# Patient Record
Sex: Female | Born: 1995 | Race: White | Hispanic: No | Marital: Single | Smoking: Never smoker
Health system: Southern US, Community
[De-identification: ages and names within clinical notes are randomized; demographics above are authoritative.]

## PROBLEM LIST (undated history)

## (undated) DIAGNOSIS — R002 Palpitations: Secondary | ICD-10-CM

## (undated) DIAGNOSIS — F321 Major depressive disorder, single episode, moderate: Secondary | ICD-10-CM

## (undated) DIAGNOSIS — E032 Hypothyroidism due to medicaments and other exogenous substances: Secondary | ICD-10-CM

## (undated) HISTORY — DX: Hypothyroidism due to medicaments and other exogenous substances: E03.2

## (undated) HISTORY — DX: Major depressive disorder, single episode, moderate: F32.1

## (undated) HISTORY — DX: Palpitations: R00.2

## (undated) HISTORY — PX: NO PAST SURGERIES: SHX2092

---

## 2019-08-27 ENCOUNTER — Other Ambulatory Visit: Payer: Self-pay

## 2019-08-27 MED ORDER — LEVOTHYROXINE SODIUM 150 MCG PO TABS: 150.0000 ug | ORAL_TABLET | Freq: Every day | ORAL | 1 refills | Status: DC

## 2019-08-31 ENCOUNTER — Other Ambulatory Visit: Payer: Self-pay

## 2019-08-31 MED ORDER — ESCITALOPRAM OXALATE 10 MG PO TABS: 10.0000 mg | ORAL_TABLET | Freq: Every day | ORAL | 1 refills | Status: DC

## 2019-09-01 ENCOUNTER — Other Ambulatory Visit: Payer: Self-pay

## 2019-09-01 MED ORDER — ESCITALOPRAM OXALATE 10 MG PO TABS: 10.0000 mg | ORAL_TABLET | Freq: Every day | ORAL | 1 refills | Status: DC

## 2019-11-04 ENCOUNTER — Other Ambulatory Visit: Payer: Self-pay

## 2019-11-04 MED ORDER — ESCITALOPRAM OXALATE 10 MG PO TABS: 10.0000 mg | ORAL_TABLET | Freq: Every day | ORAL | 0 refills | Status: DC

## 2019-11-04 MED ORDER — LEVOTHYROXINE SODIUM 137 MCG PO TABS: 137.0000 ug | ORAL_TABLET | Freq: Every day | ORAL | 1 refills | Status: DC

## 2020-01-04 ENCOUNTER — Ambulatory Visit: Payer: Managed Care, Other (non HMO) | Admitting: Legal Medicine

## 2020-01-04 ENCOUNTER — Encounter: Payer: Self-pay | Admitting: Legal Medicine

## 2020-01-04 ENCOUNTER — Other Ambulatory Visit: Payer: Self-pay

## 2020-01-04 DIAGNOSIS — E032 Hypothyroidism due to medicaments and other exogenous substances: Secondary | ICD-10-CM

## 2020-01-04 DIAGNOSIS — F321 Major depressive disorder, single episode, moderate: Secondary | ICD-10-CM | POA: Insufficient documentation

## 2020-01-04 DIAGNOSIS — E039 Hypothyroidism, unspecified: Secondary | ICD-10-CM | POA: Diagnosis not present

## 2020-01-04 MED ORDER — ESCITALOPRAM OXALATE 10 MG PO TABS: 10.0000 mg | ORAL_TABLET | Freq: Every day | ORAL | 6 refills | Status: DC

## 2020-01-04 NOTE — Progress Notes (Signed)
Subjective:  Patient ID: Rhonda Shah, female    DOB: 20-Apr-1996  Age: 24 y.o. MRN: [id number]  Chief Complaint  Patient presents with  . Hypothyroidism  . Depression    HPI: chronic visit. She recently had a miscarriage. Patient has HYPOTHYROIDISM.  Diagnosed 10 years ago.  Patient has stable thyroid readings.  Patient is having no symptoms.  Last TSH was normal.  continue dosage of thyroid medicine.  This patient has major depression for 6 mpnths.  PHQ9 =3.  Patient is having less anhedonia.  The patient has improved future plans and prospects.  The depression is worse with stress.  The patient is exercising and working on behavior to improve mental health.  Patient is not seeing a therapist or psychiatrist.  na  Patient is on lexaprox.   Current Outpatient Medications on File Prior to Visit  Medication Sig Dispense Refill  . levothyroxine (SYNTHROID) 137 MCG tablet Take 1 tablet (137 mcg total) by mouth daily before breakfast. 30 tablet 1   No current facility-administered medications on file prior to visit.   Past Medical History:  Diagnosis Date  . Hypothyroidism due to medicaments and other exogenous substances   . Major depressive disorder, single episode, moderate (HCC)   . Palpitations    History reviewed. No pertinent surgical history.  Family History  Problem Relation Age of Onset  . Hypothyroidism Mother   . Hypertension Father   . Kidney Stones Father   . Diabetes Father   . Uterine cancer Paternal Grandmother   . Lung cancer Paternal Grandfather    Social History   Socioeconomic History  . Marital status: Married    Spouse name: Not on file  . Number of children: Not on file  . Years of education: Not on file  . Highest education level: Not on file  Occupational History  . Not on file  Tobacco Use  . Smoking status: Never Smoker  . Smokeless tobacco: Never Used  Vaping Use  . Vaping Use: Never used  Substance and Sexual Activity  . Alcohol use: Not  Currently    Comment: Couple times a month  . Drug use: Not Currently  . Sexual activity: Not on file  Other Topics Concern  . Not on file  Social History Narrative  . Not on file   Social Determinants of Health   Financial Resource Strain:   . Difficulty of Paying Living Expenses:   Food Insecurity:   . Worried About Programme researcher, broadcasting/film/video in the Last Year:   . Barista in the Last Year:   Transportation Needs:   . Freight forwarder (Medical):   Marland Kitchen Lack of Transportation (Non-Medical):   Physical Activity:   . Days of Exercise per Week:   . Minutes of Exercise per Session:   Stress:   . Feeling of Stress :   Social Connections:   . Frequency of Communication with Friends and Family:   . Frequency of Social Gatherings with Friends and Family:   . Attends Religious Services:   . Active Member of Clubs or Organizations:   . Attends Banker Meetings:   Marland Kitchen Marital Status:     Review of Systems  Constitutional: Negative.   HENT: Negative.   Eyes: Negative.   Respiratory: Negative.   Cardiovascular: Negative.   Gastrointestinal: Negative.   Musculoskeletal: Negative.   Neurological: Negative.   Psychiatric/Behavioral: Positive for dysphoric mood.     Objective:  BP 126/84  Pulse 97   Temp (!) 97.5 F (36.4 C)   Ht 5\' 2"  (1.575 m)   Wt 191 lb (86.6 kg)   SpO2 98%   BMI 34.93 kg/m   BP/Weight 01/04/2020  Systolic BP 126  Diastolic BP 84  Wt. (Lbs) 191  BMI 34.93    Physical Exam Vitals reviewed.  Constitutional:      Appearance: Normal appearance.  HENT:     Head: Normocephalic and atraumatic.     Right Ear: Tympanic membrane normal.     Left Ear: Tympanic membrane normal.     Mouth/Throat:     Mouth: Mucous membranes are moist.     Pharynx: Oropharynx is clear.  Eyes:     Extraocular Movements: Extraocular movements intact.     Conjunctiva/sclera: Conjunctivae normal.     Pupils: Pupils are equal, round, and reactive to light.   Cardiovascular:     Rate and Rhythm: Normal rate and regular rhythm.     Pulses: Normal pulses.     Heart sounds: Normal heart sounds.  Pulmonary:     Effort: Pulmonary effort is normal.     Breath sounds: Normal breath sounds.  Musculoskeletal:     Cervical back: Normal range of motion and neck supple.  Skin:    General: Skin is warm.  Neurological:     General: No focal deficit present.     Mental Status: She is alert and oriented to person, place, and time.  Psychiatric:        Mood and Affect: Mood normal.        Behavior: Behavior normal.        Thought Content: Thought content normal.        Judgment: Judgment normal.     Depression screen [facility] 2/9 01/04/2020  Decreased Interest 0  Down, Depressed, Hopeless 1  PHQ - 2 Score 1  Altered sleeping 1  Tired, decreased energy 1  Change in appetite 0  Feeling bad or failure about yourself  0  Trouble concentrating 0  Moving slowly or fidgety/restless 0  Suicidal thoughts 0  PHQ-9 Score 3  Difficult doing work/chores Somewhat difficult     No results found for: WBC, HGB, HCT, PLT, GLUCOSE, CHOL, TRIG, HDL, LDLDIRECT, LDLCALC, ALT, AST, NA, K, CL, CREATININE, BUN, CO2, TSH, PSA, INR, GLUF, HGBA1C, MICROALBUR    Assessment & Plan:     1. Major depressive disorder, single episode, moderate (HCC) - escitalopram (LEXAPRO) 10 MG tablet; Take 1 tablet (10 mg total) by mouth daily. Please call to set up follow up within the next couple of months. Thanks, Dr. 01/06/2020  Dispense: 30 tablet; Refill: 6 Patient's depression is controlled with lexapro.   Anhedonia better.  PHQ 9 was performed score 3. An individual care plan was established or reinforced today.  The patient's disease status was assessed using clinical findings on exam, labs, and or other diagnostic testing to determine patient's success in meeting treatment goals based on disease specific evidence-based guidelines and found to be improving Recommendations include stay on  medicines  2. Primary hypothyroidism - TSH - Comprehensive metabolic panel Patient is known to have hypothryoidism and is n treatment with levothyroxine [place] Muta.  Patient was diagnosed 10 years ago.  Other treatment includes none.  Patient is compliant with medicines and last TSH 6 months ago.  Last TSH was high and thyroid raised    Meds ordered this encounter  Medications  . escitalopram (LEXAPRO) 10 MG tablet    Sig:  Take 1 tablet (10 mg total) by mouth daily. Please call to set up follow up within the next couple of months. Thanks, Dr. Sed[place] Muta    Dispense:  30 tablet    Refill:  6    Orders Placed This Encounter  Procedures  . TSH  . Comprehensive metabolic panel     Follow-up: Return in about 6 months (around 07/05/2020).  An After Visit Summary was printed and given to the patient.  Brent Bulla [facility] [phone]

## 2020-01-05 ENCOUNTER — Other Ambulatory Visit: Payer: Self-pay | Admitting: Legal Medicine

## 2020-01-05 ENCOUNTER — Other Ambulatory Visit: Payer: Self-pay

## 2020-01-05 DIAGNOSIS — E039 Hypothyroidism, unspecified: Secondary | ICD-10-CM

## 2020-01-05 LAB — COMPREHENSIVE METABOLIC PANEL
ALT: 12 IU/L (ref 0–32)
AST: 15 IU/L (ref 0–40)
Albumin/Globulin Ratio: 1.5 (ref 1.2–2.2)
Albumin: 4.5 g/dL (ref 3.9–5.0)
Alkaline Phosphatase: 74 IU/L (ref 48–121)
BUN/Creatinine Ratio: 15 (ref 9–23)
BUN: 9 mg/dL (ref 6–20)
Bilirubin Total: <0.2 mg/dL (ref 0.0–1.2)
CO2: 23 mmol/L (ref 20–29)
Calcium: 9.2 mg/dL (ref 8.7–10.2)
Chloride: 105 mmol/L (ref 96–106)
Creatinine, Ser: 0.59 mg/dL (ref 0.57–1.00)
GFR calc Af Amer: 147 mL/min/{1.73_m2} (ref >59)
GFR calc non Af Amer: 128 mL/min/{1.73_m2} (ref >59)
Globulin, Total: 3.1 g/dL (ref 1.5–4.5)
Glucose: 72 mg/dL (ref 65–99)
Potassium: 4.3 mmol/L (ref 3.5–5.2)
Sodium: 139 mmol/L (ref 134–144)
Total Protein: 7.6 g/dL (ref 6.0–8.5)

## 2020-01-05 LAB — TSH: TSH: 43.4 u[IU]/mL — ABNORMAL HIGH (ref 0.450–4.500)

## 2020-01-05 MED ORDER — LEVOTHYROXINE SODIUM 150 MCG PO TABS: 150.0000 ug | ORAL_TABLET | Freq: Every day | ORAL | 3 refills | Status: DC

## 2020-01-05 NOTE — Progress Notes (Signed)
Tsh high 43, increase to thyroid, recheck 10 weeks lp

## 2020-02-02 DIAGNOSIS — F321 Major depressive disorder, single episode, moderate: Secondary | ICD-10-CM | POA: Insufficient documentation

## 2020-06-26 ENCOUNTER — Emergency Department (HOSPITAL_COMMUNITY)
Admission: EM | Admit: 2020-06-26 | Discharge: 2020-06-26 | Disposition: A | Discharge status: Home / Self Care | Payer: No Typology Code available for payment source | Attending: Emergency Medicine | Admitting: Emergency Medicine

## 2020-06-26 ENCOUNTER — Emergency Department (HOSPITAL_COMMUNITY): Payer: No Typology Code available for payment source

## 2020-06-26 ENCOUNTER — Other Ambulatory Visit: Payer: Self-pay

## 2020-06-26 ENCOUNTER — Encounter (HOSPITAL_COMMUNITY): Payer: Self-pay | Admitting: Emergency Medicine

## 2020-06-26 DIAGNOSIS — R109 Unspecified abdominal pain: Secondary | ICD-10-CM | POA: Insufficient documentation

## 2020-06-26 DIAGNOSIS — R110 Nausea: Secondary | ICD-10-CM | POA: Diagnosis not present

## 2020-06-26 DIAGNOSIS — M25552 Pain in left hip: Secondary | ICD-10-CM | POA: Diagnosis not present

## 2020-06-26 DIAGNOSIS — Z5321 Procedure and treatment not carried out due to patient leaving prior to being seen by health care provider: Secondary | ICD-10-CM | POA: Diagnosis not present

## 2020-06-26 DIAGNOSIS — Y92410 Unspecified street and highway as the place of occurrence of the external cause: Secondary | ICD-10-CM | POA: Insufficient documentation

## 2020-06-26 DIAGNOSIS — M545 Low back pain, unspecified: Secondary | ICD-10-CM | POA: Diagnosis present

## 2020-06-26 DIAGNOSIS — M25551 Pain in right hip: Secondary | ICD-10-CM | POA: Diagnosis not present

## 2020-06-26 LAB — URINALYSIS, ROUTINE W REFLEX MICROSCOPIC
Bacteria, UA: NONE SEEN
Bilirubin Urine: NEGATIVE
Glucose, UA: NEGATIVE mg/dL
Ketones, ur: NEGATIVE mg/dL
Leukocytes,Ua: NEGATIVE
Nitrite: NEGATIVE
Protein, ur: NEGATIVE mg/dL
Specific Gravity, Urine: 1.004 — ABNORMAL LOW (ref 1.005–1.030)
pH: 6 (ref 5.0–8.0)

## 2020-06-26 LAB — I-STAT BETA HCG BLOOD, ED (MC, WL, AP ONLY): I-stat hCG, quantitative: <5 m[IU]/mL (ref <5)
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
## 2020-06-26 NOTE — ED Notes (Signed)
Pt stated she was leaving.  

## 2020-07-07 ENCOUNTER — Other Ambulatory Visit: Payer: Self-pay | Admitting: Legal Medicine

## 2020-07-07 DIAGNOSIS — E039 Hypothyroidism, unspecified: Secondary | ICD-10-CM

## 2020-11-17 ENCOUNTER — Other Ambulatory Visit: Payer: Self-pay | Admitting: Legal Medicine

## 2020-11-17 DIAGNOSIS — F321 Major depressive disorder, single episode, moderate: Secondary | ICD-10-CM

## 2021-05-22 ENCOUNTER — Other Ambulatory Visit: Payer: Self-pay | Admitting: Legal Medicine

## 2021-05-22 DIAGNOSIS — F321 Major depressive disorder, single episode, moderate: Secondary | ICD-10-CM

## 2021-05-22 DIAGNOSIS — E039 Hypothyroidism, unspecified: Secondary | ICD-10-CM
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
## 2022-05-02 ENCOUNTER — Ambulatory Visit: Payer: Self-pay | Admitting: Legal Medicine

## 2022-05-02 ENCOUNTER — Encounter: Payer: Self-pay | Admitting: Legal Medicine

## 2022-05-02 VITALS — BP 120/90 | HR 97 | Temp 99.5°F | Resp 14 | Ht 62.0 in | Wt 185.0 lb

## 2022-05-02 DIAGNOSIS — J9801 Acute bronchospasm: Secondary | ICD-10-CM

## 2022-05-02 MED ORDER — TRIAMCINOLONE ACETONIDE 40 MG/ML IJ SUSP
60.0000 mg | Freq: Once | INTRAMUSCULAR | Status: AC
  Administered 2022-05-02: 60 mg via INTRAMUSCULAR

## 2022-05-02 MED ORDER — AZITHROMYCIN 250 MG PO TABS: ORAL_TABLET | ORAL | 0 refills | Status: AC

## 2022-05-02 MED ORDER — ALBUTEROL SULFATE HFA 108 (90 BASE) MCG/ACT IN AERS
2.0000 | INHALATION_SPRAY | Freq: Four times a day (QID) | RESPIRATORY_TRACT | 0 refills | Status: DC | PRN
Start: 2022-05-02 — End: 2022-11-20

## 2022-05-02 NOTE — Progress Notes (Signed)
Acute Office Visit  Subjective:    Patient ID: Rhonda Shah, female    DOB: 03-Jun-1996, 26 y.o.   MRN: [id number]  Chief Complaint  Patient presents with   Cough   chest congestion    HPI: Patient is in today for cough, and chest congestion since 3 weeks ago. She did covid test at home today and it was negative.Having cough.. No history of asthma.  No definite wheezing.   Past Medical History:  Diagnosis Date   Hypothyroidism due to medicaments and other exogenous substances    Major depressive disorder, single episode, moderate (HCC)    Palpitations     Past Surgical History:  Procedure Laterality Date   NO PAST SURGERIES      Family History  Problem Relation Age of Onset   Hypothyroidism Mother    Hypertension Father    Kidney Stones Father    Diabetes Father    Uterine cancer Paternal Grandmother    Lung cancer Paternal Grandfather     Social History   Socioeconomic History   Marital status: Married    Spouse name: Not on file   Number of children: Not on file   Years of education: Not on file   Highest education level: Not on file  Occupational History   Not on file  Tobacco Use   Smoking status: Never   Smokeless tobacco: Never  Vaping Use   Vaping Use: Never used  Substance and Sexual Activity   Alcohol use: Yes    Comment: Couple times a month   Drug use: Not Currently   Sexual activity: Not Currently    Partners: Male  Other Topics Concern   Not on file  Social History Narrative   Not on file   Social Determinants of Health   Financial Resource Strain: Not on file  Food Insecurity: Not on file  Transportation Needs: Not on file  Physical Activity: Not on file  Stress: Not on file  Social Connections: Not on file  Intimate Partner Violence: Not on file    Outpatient Medications Prior to Visit  Medication Sig Dispense Refill   levothyroxine (SYNTHROID) 150 MCG tablet Take 1 tablet by mouth once daily 90 tablet 2   escitalopram  (LEXAPRO) 10 MG tablet Take 1 tablet by mouth once daily 30 tablet 0   No facility-administered medications prior to visit.    Allergies  Allergen Reactions   Amoxicillin-Pot Clavulanate Itching    Review of Systems  Constitutional:  Positive for fever. Negative for chills and fatigue.  HENT:  Negative for congestion, ear pain, sinus pressure and sore throat.   Respiratory:  Positive for cough, chest tightness and shortness of breath.   Cardiovascular:  Negative for chest pain and palpitations.  Gastrointestinal:  Negative for abdominal pain, constipation, diarrhea, nausea and vomiting.  Endocrine: Negative for polydipsia, polyphagia and polyuria.  Genitourinary:  Negative for difficulty urinating and dysuria.  Musculoskeletal:  Negative for arthralgias, back pain and myalgias.  Skin:  Negative for rash.  Neurological:  Negative for headaches.  Psychiatric/Behavioral:  Negative for dysphoric mood. The patient is not nervous/anxious.        Objective:    Physical Exam Vitals reviewed.  Constitutional:      General: She is not in acute distress.    Appearance: Normal appearance.  HENT:     Head: Normocephalic and atraumatic.     Right Ear: Tympanic membrane normal.     Left Ear: Tympanic membrane normal.  Nose: Nose normal.     Mouth/Throat:     Mouth: Mucous membranes are moist.  Eyes:     Extraocular Movements: Extraocular movements intact.     Conjunctiva/sclera: Conjunctivae normal.     Pupils: Pupils are equal, round, and reactive to light.  Cardiovascular:     Rate and Rhythm: Normal rate and regular rhythm.     Pulses: Normal pulses.     Heart sounds: No murmur heard.    No gallop.  Pulmonary:     Effort: Pulmonary effort is normal. No respiratory distress.     Breath sounds: Normal breath sounds. No wheezing.  Abdominal:     General: Abdomen is flat. Bowel sounds are normal. There is no distension.     Tenderness: There is no abdominal tenderness.   Musculoskeletal:        General: Normal range of motion.     Cervical back: Normal range of motion and neck supple.     Right lower leg: No edema.     Left lower leg: No edema.  Skin:    General: Skin is warm.     Capillary Refill: Capillary refill takes less than 2 seconds.  Neurological:     General: No focal deficit present.     Mental Status: She is alert and oriented to person, place, and time. Mental status is at baseline.     Gait: Gait normal.     Deep Tendon Reflexes: Reflexes normal.  Psychiatric:        Mood and Affect: Mood normal.        Behavior: Behavior normal.        Thought Content: Thought content normal.        Judgment: Judgment normal.     BP (!) 120/90   Pulse 97   Temp 99.5 F (37.5 C)   Resp 14   Ht 5\' 2"  (1.575 m)   Wt 185 lb (83.9 kg)   LMP 05/01/2022 (Exact Date)   SpO2 99%   BMI 33.84 kg/m  Wt Readings from Last 3 Encounters:  05/02/22 185 lb (83.9 kg)  01/04/20 191 lb (86.6 kg)    Health Maintenance Due  Topic Date Due   HIV Screening  Never done   Hepatitis C Screening  Never done   TETANUS/TDAP  Never done   PAP-Cervical Cytology Screening  Never done   PAP SMEAR-Modifier  Never done   COVID-19 Vaccine (3 - Pfizer series) 07/06/2021   INFLUENZA VACCINE  Never done    There are no preventive care reminders to display for this patient.   Lab Results  Component Value Date   TSH 43.400 (H) 01/04/2020   No results found for: "WBC", "HGB", "HCT", "MCV", "PLT" Lab Results  Component Value Date   NA 139 01/04/2020   K 4.3 01/04/2020   CO2 23 01/04/2020   GLUCOSE 72 01/04/2020   BUN 9 01/04/2020   CREATININE 0.59 01/04/2020   BILITOT <0.2 01/04/2020   ALKPHOS 74 01/04/2020   AST 15 01/04/2020   ALT 12 01/04/2020   PROT 7.6 01/04/2020   ALBUMIN 4.5 01/04/2020   CALCIUM 9.2 01/04/2020   No results found for: "CHOL" No results found for: "HDL" No results found for: "LDLCALC" No results found for: "TRIG" No results found  for: "CHOLHDL" No results found for: "HGBA1C"     Assessment & Plan:   Problem List Items Addressed This Visit    Visit Diagnoses     Bronchospasm    -  Primary   Relevant Medications   triamcinolone acetonide (KENALOG-40) injection 60 mg (Completed)   azithromycin (ZITHROMAX) 250 MG tablet   albuterol (VENTOLIN HFA) 108 (90 Base) MCG/ACT inhaler Treat bronchospasm with albuterol and trimcinolone, z-pack           Follow-up: Return if symptoms worsen or fail to improve.  An After Visit Summary was printed and given to the patient.  Reinaldo Meeker, MD [facility] [phone]

## 2022-06-16 ENCOUNTER — Other Ambulatory Visit: Payer: Self-pay | Admitting: Legal Medicine

## 2022-06-16 DIAGNOSIS — E039 Hypothyroidism, unspecified: Secondary | ICD-10-CM

## 2022-06-29 ENCOUNTER — Ambulatory Visit: Payer: Self-pay | Admitting: Nurse Practitioner

## 2022-07-18 ENCOUNTER — Encounter: Payer: Self-pay | Admitting: Nurse Practitioner

## 2022-07-18 ENCOUNTER — Ambulatory Visit (INDEPENDENT_AMBULATORY_CARE_PROVIDER_SITE_OTHER): Payer: Self-pay | Admitting: Nurse Practitioner

## 2022-07-18 VITALS — BP 132/74 | HR 102 | Temp 97.5°F | Ht 62.0 in | Wt 178.0 lb

## 2022-07-18 DIAGNOSIS — J029 Acute pharyngitis, unspecified: Secondary | ICD-10-CM

## 2022-07-18 DIAGNOSIS — N926 Irregular menstruation, unspecified: Secondary | ICD-10-CM

## 2022-07-18 LAB — POCT URINE PREGNANCY: Preg Test, Ur: NEGATIVE

## 2022-07-18 MED ORDER — AZITHROMYCIN 250 MG PO TABS: ORAL_TABLET | ORAL | 0 refills | Status: AC

## 2022-07-18 NOTE — Progress Notes (Signed)
Acute Office Visit  Subjective:    Patient ID: Rhonda Shah, female    DOB: Mar 16, 1996, 27 y.o.   MRN: [id number]  Chief Complaint  Patient presents with   Sore Throat    HPI: Patient is in today for Sore throat symptoms She complains of left ear pressure/pain, congestion, post nasal drip, and sore throat. Denies fever, chills, night sweats or weight loss. Onset of symptoms was a few days ago and worsening.She is drinking plenty of fluids. Treatment has included antihistamine, Delsym, Mucinex, cough drops, salt water gargles, Tylenol/Ibuprofen. 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    Past Surgical History:  Procedure Laterality Date   NO PAST SURGERIES      Family History  Problem Relation Age of Onset   Hypothyroidism Mother    Hypertension Father    Kidney Stones Father    Diabetes Father    Uterine cancer Paternal Grandmother    Lung cancer Paternal Grandfather     Social History   Socioeconomic History   Marital status: Married    Spouse name: Not on file   Number of children: Not on file   Years of education: Not on file   Highest education level: Not on file  Occupational History   Not on file  Tobacco Use   Smoking status: Never   Smokeless tobacco: Never  Vaping Use   Vaping Use: Never used  Substance and Sexual Activity   Alcohol use: Yes    Comment: Couple times a month   Drug use: Not Currently   Sexual activity: Not Currently    Partners: Male  Other Topics Concern   Not on file  Social History Narrative   Not on file   Social Determinants of Health   Financial Resource Strain: Not on file  Food Insecurity: Not on file  Transportation Needs: Not on file  Physical Activity: Not on file  Stress: Not on file  Social Connections: Not on file  Intimate Partner Violence: Not on file    Outpatient Medications Prior to Visit  Medication Sig Dispense Refill   albuterol (VENTOLIN HFA) 108 (90 Base) MCG/ACT  inhaler Inhale 2 puffs into the lungs every 6 (six) hours as needed for wheezing or shortness of breath. 8 g 0   levothyroxine (SYNTHROID) 150 MCG tablet Take 1 tablet by mouth once daily 90 tablet 2   No facility-administered medications prior to visit.    Allergies  Allergen Reactions   Amoxicillin-Pot Clavulanate Itching    Review of Systems See pertinent positives and negatives per HPI.     Objective:    Physical Exam Vitals reviewed.  HENT:     Right Ear: Tympanic membrane normal.     Left Ear: Tympanic membrane normal.     Nose: Congestion and rhinorrhea present.     Mouth/Throat:     Pharynx: Uvula midline. Pharyngeal swelling, posterior oropharyngeal erythema and uvula swelling present.     Tonsils: No tonsillar exudate or tonsillar abscesses. 2+ on the right. 2+ on the left.  Cardiovascular:     Rate and Rhythm: Tachycardia present.     Heart sounds: Normal heart sounds.  Pulmonary:     Effort: Pulmonary effort is normal.     Breath sounds: Normal breath sounds.  Lymphadenopathy:     Cervical: Cervical adenopathy present.  Skin:    General: Skin is warm and dry.     Capillary Refill: Capillary refill takes less than 2  seconds.  Neurological:     Mental Status: She is alert.     BP 132/74   Pulse (!) 102   Temp (!) 97.5 F (36.4 C)   Ht 5\' 2"  (1.575 m)   Wt 178 lb (80.7 kg)   SpO2 99%   BMI 32.56 kg/m   Wt Readings from Last 3 Encounters:  07/18/22 178 lb (80.7 kg)  05/02/22 185 lb (83.9 kg)  01/04/20 191 lb (86.6 kg)    Health Maintenance Due  Topic Date Due   HIV Screening  Never done   Hepatitis C Screening  Never done   DTaP/Tdap/Td (1 - Tdap) Never done   PAP-Cervical Cytology Screening  Never done   PAP SMEAR-Modifier  Never done   COVID-19 Vaccine (3 - 2023-24 season) 03/09/2022       Lab Results  Component Value Date   TSH 43.400 (H) 01/04/2020   No results found for: "WBC", "HGB", "HCT", "MCV", "PLT" Lab Results  Component  Value Date   NA 139 01/04/2020   K 4.3 01/04/2020   CO2 23 01/04/2020   GLUCOSE 72 01/04/2020   BUN 9 01/04/2020   CREATININE 0.59 01/04/2020   BILITOT <0.2 01/04/2020   ALKPHOS 74 01/04/2020   AST 15 01/04/2020   ALT 12 01/04/2020   PROT 7.6 01/04/2020   ALBUMIN 4.5 01/04/2020   CALCIUM 9.2 01/04/2020      Assessment & Plan:     1. Pharyngitis, unspecified etiology - Mononucleosis Test, Qual W/ Reflex - CBC with Differential/Platelet - azithromycin (ZITHROMAX) 250 MG tablet; Take 2 tablets on day 1, then 1 tablet daily on days 2 through 5  Dispense: 6 tablet; Refill: 0  2. Irregular periods/menstrual cycles - T4, free - TSH - POCT urine pregnancy - CBC with Differential/Platelet - Comprehensive metabolic panel    Warm salt water gargles Replace toothbrush and toothpaste Rest and push fluids Take Z-pack as directed, with food Ibuprofen as needed for pain Follow-up as needed   Follow-up: PRN  An After Visit Summary was printed and given to the patient.  I, Rip Harbour, NP, have reviewed all documentation for this visit. The documentation on 07/18/22 for the exam, diagnosis, procedures, and orders are all accurate and complete.    Signed,  Rip Harbour, NP [place] [facility] [phone]

## 2022-07-18 NOTE — Patient Instructions (Addendum)
Warm salt water gargles Replace toothbrush and toothpaste Rest and push fluids Take Z-pack as directed, with food Ibuprofen as needed for pain Follow-up as needed   Pharyngitis  Pharyngitis is inflammation of the throat (pharynx). It is a very common cause of sore throat. Pharyngitis can be caused by a bacteria, but it is usually caused by a virus. Most cases of pharyngitis get better on their own without treatment. What are the causes? This condition may be caused by: Infection by viruses (viral). Viral pharyngitis spreads easily from person to person (is contagious) through coughing, sneezing, and sharing of personal items or utensils such as cups, forks, spoons, and toothbrushes. Infection by bacteria (bacterial). Bacterial pharyngitis may be spread by touching the nose or face after coming in contact with the bacteria, or through close contact, such as kissing. Allergies. Allergies can cause buildup of mucus in the throat (post-nasal drip), leading to inflammation and irritation. Allergies can also cause blocked nasal passages, forcing breathing through the mouth, which dries and irritates the throat. What increases the risk? You are more likely to develop this condition if: You are 62-37 years old. You are exposed to crowded environments such as daycare, school, or dormitory living. You live in a cold climate. You have a weakened disease-fighting (immune) system. What are the signs or symptoms? Symptoms of this condition vary by the cause. Common symptoms of this condition include: Sore throat. Fatigue. Low-grade fever. Stuffy nose (nasal congestion) and cough. Headache. Other symptoms may include: Glands in the neck (lymph nodes) that are swollen. Skin rashes. Plaque-like film on the throat or tonsils. This is often a symptom of bacterial pharyngitis. Vomiting. Red, itchy eyes (conjunctivitis). Loss of appetite. Joint pain and muscle aches. Enlarged tonsils. How is this  diagnosed? This condition may be diagnosed based on your medical history and a physical exam. Your health care provider will ask you questions about your illness and your symptoms. A swab of your throat may be done to check for bacteria (rapid strep test). Other lab tests may also be done, depending on the suspected cause, but these are rare. How is this treated? Many times, treatment is not needed for this condition. Pharyngitis usually gets better in 3-4 days without treatment. Bacterial pharyngitis may be treated with antibiotic medicines. Follow these instructions at home: Medicines Take over-the-counter and prescription medicines only as told by your health care provider. If you were prescribed an antibiotic medicine, take it as told by your health care provider. Do not stop taking the antibiotic even if you start to feel better. Use throat sprays to soothe your throat as told by your health care provider. Children can get pharyngitis. Do not give your child aspirin because of the association with Reye's syndrome. Managing pain To help with pain, try: Sipping warm liquids, such as broth, herbal tea, or warm water. Eating or drinking cold or frozen liquids, such as frozen ice pops. Gargling with a mixture of salt and water 3-4 times a day or as needed. To make salt water, completely dissolve -1 tsp (3-6 g) of salt in 1 cup (237 mL) of warm water. Sucking on hard candy or throat lozenges. Putting a cool-mist humidifier in your bedroom at night to moisten the air. Sitting in the bathroom with the door closed for 5-10 minutes while you run hot water in the shower.  General instructions  Do not use any products that contain nicotine or tobacco. These products include cigarettes, chewing tobacco, and vaping devices, such as  e-cigarettes. If you need help quitting, ask your health care provider. Rest as told by your health care provider. Drink enough fluid to keep your urine pale yellow. How  is this prevented? To help prevent becoming infected or spreading infection: Wash your hands often with soap and water for at least 20 seconds. If soap and water are not available, use hand sanitizer. Do not touch your eyes, nose, or mouth with unwashed hands, and wash hands after touching these areas. Do not share cups or eating utensils. Avoid close contact with people who are sick. Contact a health care provider if: You have large, tender lumps in your neck. You have a rash. You cough up green, yellow-brown, or bloody mucus. Get help right away if: Your neck becomes stiff. You drool or are unable to swallow liquids. You cannot drink or take medicines without vomiting. You have severe pain that does not go away, even after you take medicine. You have trouble breathing, and it is not caused by a stuffy nose. You have new pain and swelling in your joints such as the knees, ankles, wrists, or elbows. These symptoms may represent a serious problem that is an emergency. Do not wait to see if the symptoms will go away. Get medical help right away. Call your local emergency services (911 in the [place] Do not drive yourself to the hospital. Summary Pharyngitis is redness, pain, and swelling (inflammation) of the throat (pharynx). While pharyngitis can be caused by a bacteria, the most common causes are viral. Most cases of pharyngitis get better on their own without treatment. Bacterial pharyngitis is treated with antibiotic medicines. This information is not intended to replace advice given to you by your health care provider. Make sure you discuss any questions you have with your health care provider. Document Revised: 09/21/2020 Document Reviewed: 09/21/2020 Elsevier Patient Education  [place].

## 2022-07-19 LAB — COMPREHENSIVE METABOLIC PANEL
ALT: 13 IU/L (ref 0–32)
AST: 12 IU/L (ref 0–40)
Albumin/Globulin Ratio: 1.5 (ref 1.2–2.2)
Albumin: 4.2 g/dL (ref 4.0–5.0)
Alkaline Phosphatase: 73 IU/L (ref 44–121)
BUN/Creatinine Ratio: 17 (ref 9–23)
BUN: 12 mg/dL (ref 6–20)
Bilirubin Total: <0.2 mg/dL (ref 0.0–1.2)
CO2: 21 mmol/L (ref 20–29)
Calcium: 8.8 mg/dL (ref 8.7–10.2)
Chloride: 106 mmol/L (ref 96–106)
Creatinine, Ser: 0.72 mg/dL (ref 0.57–1.00)
Globulin, Total: 2.8 g/dL (ref 1.5–4.5)
Glucose: 85 mg/dL (ref 70–99)
Potassium: 4.4 mmol/L (ref 3.5–5.2)
Sodium: 144 mmol/L (ref 134–144)
Total Protein: 7 g/dL (ref 6.0–8.5)
eGFR: 117 mL/min/{1.73_m2} (ref >59)

## 2022-07-19 LAB — CBC WITH DIFFERENTIAL/PLATELET
Basophils Absolute: 0 10*3/uL (ref 0.0–0.2)
Basos: 0 %
EOS (ABSOLUTE): 0.1 10*3/uL (ref 0.0–0.4)
Eos: 1 %
Hematocrit: 40.2 % (ref 34.0–46.6)
Hemoglobin: 13 g/dL (ref 11.1–15.9)
Immature Grans (Abs): 0 10*3/uL (ref 0.0–0.1)
Immature Granulocytes: 0 %
Lymphocytes Absolute: 1.8 10*3/uL (ref 0.7–3.1)
Lymphs: 17 %
MCH: 27.4 pg (ref 26.6–33.0)
MCHC: 32.3 g/dL (ref 31.5–35.7)
MCV: 85 fL (ref 79–97)
Monocytes Absolute: 0.6 10*3/uL (ref 0.1–0.9)
Monocytes: 6 %
Neutrophils Absolute: 7.6 10*3/uL — ABNORMAL HIGH (ref 1.4–7.0)
Neutrophils: 76 %
Platelets: 362 10*3/uL (ref 150–450)
RBC: 4.74 x10E6/uL (ref 3.77–5.28)
RDW: 15.9 % — ABNORMAL HIGH (ref 11.7–15.4)
WBC: 10.2 10*3/uL (ref 3.4–10.8)

## 2022-07-19 LAB — MONO QUAL W/RFLX QN: Mono Qual W/Rflx Qn: NEGATIVE

## 2022-07-19 LAB — T4, FREE: Free T4: 1.23 ng/dL (ref 0.82–1.77)

## 2022-07-19 LAB — TSH: TSH: 14.3 u[IU]/mL — ABNORMAL HIGH (ref 0.450–4.500)

## 2022-07-31 ENCOUNTER — Other Ambulatory Visit: Payer: Self-pay

## 2022-07-31 DIAGNOSIS — E039 Hypothyroidism, unspecified: Secondary | ICD-10-CM

## 2022-11-20 ENCOUNTER — Ambulatory Visit (INDEPENDENT_AMBULATORY_CARE_PROVIDER_SITE_OTHER): Payer: Self-pay | Admitting: Family Medicine

## 2022-11-20 ENCOUNTER — Encounter: Payer: Self-pay | Admitting: Family Medicine

## 2022-11-20 VITALS — BP 130/86 | HR 99 | Temp 98.4°F | Resp 12 | Ht 62.0 in | Wt 176.0 lb

## 2022-11-20 DIAGNOSIS — R5081 Fever presenting with conditions classified elsewhere: Secondary | ICD-10-CM

## 2022-11-20 DIAGNOSIS — H8111 Benign paroxysmal vertigo, right ear: Secondary | ICD-10-CM

## 2022-11-20 DIAGNOSIS — R509 Fever, unspecified: Secondary | ICD-10-CM | POA: Insufficient documentation

## 2022-11-20 LAB — POCT URINALYSIS DIP (CLINITEK)
Bilirubin, UA: NEGATIVE
Glucose, UA: NEGATIVE mg/dL
Ketones, POC UA: NEGATIVE mg/dL
Leukocytes, UA: NEGATIVE
Nitrite, UA: NEGATIVE
POC PROTEIN,UA: NEGATIVE
Spec Grav, UA: 1.015 (ref 1.010–1.025)
Urobilinogen, UA: 0.2 E.U./dL
pH, UA: 8.5 — AB (ref 5.0–8.0)

## 2022-11-20 LAB — POCT INFLUENZA A/B
Influenza A, POC: NEGATIVE
Influenza B, POC: NEGATIVE

## 2022-11-20 NOTE — Assessment & Plan Note (Signed)
Exercises for BPV was given.

## 2022-11-20 NOTE — Progress Notes (Signed)
Acute Office Visit  Subjective:    Patient ID: Rhonda Shah, female    DOB: 23-Jan-1996, 27 y.o.   MRN: [id number]  Chief Complaint  Patient presents with   Fever    HPI: Patient is in today for 5 days of fever, chills. She has been checking her temperature at home and last Thursday was 101.6, then came back normal. Sunday was 100.6 and yesterday was 100.8. she has been taking tylenol and ibuprofen for fever. She also mentioned lightheaded and dizziness. Her period was heavier last Sunday. 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 She denies thick bite. She said to have some allergies and some flank/back pain. She denies any chest pain, SOB, cough, chest congestion, dysuria, abdominal pain, diarrhea or nauseas.  Past Medical History:  Diagnosis Date   Hypothyroidism due to medicaments and other exogenous substances    Major depressive disorder, single episode, moderate (HCC)    Palpitations     Past Surgical History:  Procedure Laterality Date   NO PAST SURGERIES      Family History  Problem Relation Age of Onset   Hypothyroidism Mother    Hypertension Father    Kidney Stones Father    Diabetes Father    Uterine cancer Paternal Grandmother    Lung cancer Paternal Grandfather     Social History   Socioeconomic History   Marital status: Married    Spouse name: Not on file   Number of children: Not on file   Years of education: Not on file   Highest education level: Not on file  Occupational History   Not on file  Tobacco Use   Smoking status: Never   Smokeless tobacco: Never  Vaping Use   Vaping Use: Never used  Substance and Sexual Activity   Alcohol use: Yes    Comment: Couple times a month   Drug use: Not Currently   Sexual activity: Not Currently    Partners: Male  Other Topics Concern   Not on file  Social History Narrative   Not on file   Social Determinants of Health   Financial Resource Strain: Not on file  Food Insecurity: Not  on file  Transportation Needs: Not on file  Physical Activity: Not on file  Stress: Not on file  Social Connections: Not on file  Intimate Partner Violence: Not on file    Outpatient Medications Prior to Visit  Medication Sig Dispense Refill   levothyroxine (SYNTHROID) 150 MCG tablet Take 1 tablet by mouth once daily 90 tablet 2   albuterol (VENTOLIN HFA) 108 (90 Base) MCG/ACT inhaler Inhale 2 puffs into the lungs every 6 (six) hours as needed for wheezing or shortness of breath. 8 g 0   No facility-administered medications prior to visit.    Allergies  Allergen Reactions   Amoxicillin-Pot Clavulanate Itching    Review of Systems  Constitutional:  Positive for chills and fever. Negative for fatigue.  HENT:  Positive for sneezing. Negative for congestion, ear pain and sore throat.   Respiratory:  Negative for cough and shortness of breath.   Cardiovascular:  Negative for chest pain.  Gastrointestinal:  Negative for abdominal pain, constipation, diarrhea and nausea.  Musculoskeletal:  Positive for back pain and myalgias.  Neurological:  Positive for dizziness and light-headedness.       Objective:        11/20/2022    9:48 AM 07/18/2022    8:27 AM 05/02/2022   10:59 AM  Vitals  with BMI  Height 5\' 2"  5\' 2"    Weight 176 lbs 178 lbs   BMI 32.18 32.55   Systolic 130 132 098  Diastolic 86 74 90  Pulse 99 102     No data found.   Physical Exam Vitals reviewed.  Constitutional:      Appearance: Normal appearance. She is normal weight.  HENT:     Right Ear: Tympanic membrane, ear canal and external ear normal.     Left Ear: Tympanic membrane, ear canal and external ear normal.     Nose: Nose normal.     Right Turbinates: Swollen (redness).     Left Turbinates: Swollen and pale (blue).     Comments: Nontender Sinuses    Mouth/Throat:     Pharynx: Oropharynx is clear.  Cardiovascular:     Rate and Rhythm: Normal rate and regular rhythm.     Heart sounds: Normal  heart sounds. No murmur heard. Pulmonary:     Effort: Pulmonary effort is normal. No respiratory distress.     Breath sounds: Normal breath sounds.  Abdominal:     General: Abdomen is flat. Bowel sounds are normal.     Palpations: Abdomen is soft.     Tenderness: There is no abdominal tenderness.  Lymphadenopathy:     Cervical: No cervical adenopathy.  Neurological:     Mental Status: She is alert and oriented to person, place, and time.     Comments: Epley maneuver positive to the right. Negative to the left.   Psychiatric:        Mood and Affect: Mood normal.        Behavior: Behavior normal.     Health Maintenance Due  Topic Date Due   HIV Screening  Never done   Hepatitis C Screening  Never done   DTaP/Tdap/Td (1 - Tdap) Never done   PAP-Cervical Cytology Screening  Never done   PAP SMEAR-Modifier  Never done   COVID-19 Vaccine (3 - 2023-24 season) 03/09/2022    There are no preventive care reminders to display for this patient.   Lab Results  Component Value Date   TSH 14.300 (H) 07/18/2022   Lab Results  Component Value Date   WBC 10.2 07/18/2022   HGB 13.0 07/18/2022   HCT 40.2 07/18/2022   MCV 85 07/18/2022   PLT 362 07/18/2022   Lab Results  Component Value Date   NA 144 07/18/2022   K 4.4 07/18/2022   CO2 21 07/18/2022   GLUCOSE 85 07/18/2022   BUN 12 07/18/2022   CREATININE 0.72 07/18/2022   BILITOT <0.2 07/18/2022   ALKPHOS 73 07/18/2022   AST 12 07/18/2022   ALT 13 07/18/2022   PROT 7.0 07/18/2022   ALBUMIN 4.2 07/18/2022   CALCIUM 8.8 07/18/2022   EGFR 117 07/18/2022   No results found for: "CHOL" No results found for: "HDL" No results found for: "LDLCALC" No results found for: "TRIG" No results found for: "CHOLHDL" No results found for: "HGBA1C"     Assessment & Plan:  Benign paroxysmal positional vertigo of right ear Assessment & Plan: Exercises for BPV was given.   Fever in other diseases Assessment & Plan: Ordered rapid  flu A and B. Ordered UA.  Orders: -     POCT URINALYSIS DIP (CLINITEK) -     POCT Influenza A/B    All negative.  No orders of the defined types were placed in this encounter.   Orders Placed This Encounter  Procedures  POCT URINALYSIS DIP (CLINITEK)   Influenza A/B     Follow-up: No follow-ups on file.  An After Visit Summary was printed and given to the patient.  Blane Ohara, MD [facility] [phone]

## 2022-11-20 NOTE — Assessment & Plan Note (Signed)
Ordered rapid flu A and B. Ordered UA.

## 2022-11-26 ENCOUNTER — Encounter: Payer: Self-pay | Admitting: Family Medicine

## 2022-11-27 ENCOUNTER — Other Ambulatory Visit: Payer: Self-pay

## 2022-11-27 MED ORDER — BUSPIRONE HCL 7.5 MG PO TABS: 7.5000 mg | ORAL_TABLET | Freq: Two times a day (BID) | ORAL | 2 refills | Status: DC

## 2022-12-07 ENCOUNTER — Encounter: Payer: Self-pay | Admitting: Family Medicine

## 2022-12-12 ENCOUNTER — Other Ambulatory Visit: Payer: Self-pay | Admitting: Family Medicine

## 2022-12-12 MED ORDER — DESVENLAFAXINE SUCCINATE ER 25 MG PO TB24
25.0000 mg | ORAL_TABLET | Freq: Every day | ORAL | 0 refills | Status: DC
Start: 2022-12-12 — End: 2023-03-07

## 2022-12-27 ENCOUNTER — Ambulatory Visit: Payer: Self-pay | Admitting: Physician Assistant

## 2022-12-27 ENCOUNTER — Encounter: Payer: Self-pay | Admitting: Physician Assistant

## 2022-12-27 VITALS — BP 130/70 | HR 94 | Temp 97.5°F | Ht 62.0 in | Wt 172.0 lb

## 2022-12-27 DIAGNOSIS — N3001 Acute cystitis with hematuria: Secondary | ICD-10-CM

## 2022-12-27 LAB — POCT URINALYSIS DIP (CLINITEK)
Bilirubin, UA: NEGATIVE
Glucose, UA: NEGATIVE mg/dL
Ketones, POC UA: NEGATIVE mg/dL
Nitrite, UA: NEGATIVE
Spec Grav, UA: 1.01 (ref 1.010–1.025)
Urobilinogen, UA: 0.2 E.U./dL
pH, UA: 7 (ref 5.0–8.0)

## 2022-12-27 MED ORDER — DOXYCYCLINE HYCLATE 100 MG PO TABS: 100.0000 mg | ORAL_TABLET | Freq: Two times a day (BID) | ORAL | 0 refills | Status: DC

## 2022-12-27 NOTE — Progress Notes (Signed)
   Acute Office Visit  Subjective:    Patient ID: Rhonda Shah, female    DOB: 1996/01/21, 27 y.o.   MRN: [id number]  Chief Complaint  Patient presents with   Urinary Tract Infection    HPI: Patient is in today for complaints of suprapubic pressure, urinary frequency and nocturia that started yesterday.  She denies nausea or vomiting.  Bowel movements normal and appetite normal Denies vaginal pain, discharge or itching LMP 12/13/22 Pt states that she has history of many recurrent utis in the past and sometimes with episodes of utis that would not clear -- has not had one in several months however   Current Outpatient Medications:    desvenlafaxine (PRISTIQ) 25 MG 24 hr tablet, Take 1 tablet (25 mg total) by mouth daily., Disp: 30 tablet, Rfl: 0   doxycycline (VIBRA-TABS) 100 MG tablet, Take 1 tablet (100 mg total) by mouth 2 (two) times daily., Disp: 20 tablet, Rfl: 0   levothyroxine (SYNTHROID) 150 MCG tablet, Take 1 tablet by mouth once daily, Disp: 90 tablet, Rfl: 2  Allergies  Allergen Reactions   Amoxicillin-Pot Clavulanate Itching   Buspirone     palpitations    ROS CONSTITUTIONAL: Negative for chills, fatigue, fever,   CARDIOVASCULAR: Negative for chest pain,  RESPIRATORY: Negative for recent cough and dyspnea.  GASTROINTESTINAL: Negative for abdominal pain, acid reflux symptoms, constipation, diarrhea, nausea and vomiting.  GU - see HPI     Objective:    PHYSICAL EXAM:   BP 130/70 (BP Location: Left Arm, Patient Position: Sitting, Cuff Size: Large)   Pulse 94   Temp (!) 97.5 F (36.4 C) (Temporal)   Ht 5\' 2"  (1.575 m)   Wt 172 lb (78 kg)   SpO2 97%   BMI 31.46 kg/m    GEN: Well nourished, well developed, in no acute distress  Cardiac: RRR; no murmurs, rubs, or gallops,no edema - Respiratory:  normal respiratory rate and pattern with no distress - normal breath sounds with no rales, rhonchi, wheezes or rubs GI: normal bowel sounds, no masses - mild  suprapubic tenderness  Office Visit on 12/27/2022  Component Date Value Ref Range Status   Color, UA 12/27/2022 orange (A)  yellow Final   Clarity, UA 12/27/2022 cloudy (A)  clear Final   Glucose, UA 12/27/2022 negative  negative mg/dL Final   Bilirubin, UA 65/78/4696 negative  negative Final   Ketones, POC UA 12/27/2022 negative  negative mg/dL Final   Spec Grav, UA 29/52/8413 1.010  1.010 - 1.025 Final   Blood, UA 12/27/2022 small (A)  negative Final   pH, UA 12/27/2022 7.0  5.0 - 8.0 Final   POC PROTEIN,UA 12/27/2022 trace  negative, trace Final   Urobilinogen, UA 12/27/2022 0.2  0.2 or 1.0 E.U./dL Final   Nitrite, UA 24/40/1027 Negative  Negative Final   Leukocytes, UA 12/27/2022 Trace (A)  Negative Final        Assessment & Plan:    Acute cystitis with hematuria -     POCT URINALYSIS DIP (CLINITEK) -     Urine Culture -     Doxycycline Hyclate; Take 1 tablet (100 mg total) by mouth 2 (two) times daily.  Dispense: 20 tablet; Refill: 0 Recommend push water - can take otc AZO as directed    Follow-up: Return if symptoms worsen or fail to improve.  An After Visit Summary was printed and given to the patient.  Jettie Pagan [facility] [phone]

## 2022-12-29 LAB — URINE CULTURE

## 2023-01-01 ENCOUNTER — Encounter: Payer: Self-pay | Admitting: Physician Assistant

## 2023-03-03 ENCOUNTER — Encounter: Payer: Self-pay | Admitting: Family Medicine

## 2023-03-05 NOTE — Telephone Encounter (Signed)
I spoke with the patient this morning an offered her an appointment today to be seen by Lajuana Matte, FNP. The patient stated that she is unable to do this do to her insurance will not be active with her new job until another month. I told the patient that we offer a self pay discount of 62 %. The patient stated that she has been self pay with our office before. She did not want to schedule an appointment at this time. The patient stated that she will call back or go somewhere else.

## 2023-03-07 ENCOUNTER — Ambulatory Visit: Payer: Self-pay | Admitting: Family Medicine

## 2023-03-07 ENCOUNTER — Encounter: Payer: Self-pay | Admitting: Family Medicine

## 2023-03-07 VITALS — BP 122/80 | HR 90 | Temp 97.7°F | Ht 62.0 in | Wt 165.8 lb

## 2023-03-07 DIAGNOSIS — F411 Generalized anxiety disorder: Secondary | ICD-10-CM

## 2023-03-07 DIAGNOSIS — E039 Hypothyroidism, unspecified: Secondary | ICD-10-CM

## 2023-03-07 MED ORDER — ESCITALOPRAM OXALATE 10 MG PO TABS: 10.0000 mg | ORAL_TABLET | Freq: Every day | ORAL | 3 refills | Status: DC

## 2023-03-07 NOTE — Progress Notes (Signed)
Subjective:  Patient ID: Rhonda Shah, female    DOB: [birth year]  Age: 27 y.o. MRN: [id number]  Chief Complaint  Patient presents with   Anxiety    HPI  Anxiety: Patient present in office today for depression and anxiety. Patient has been on Pristiq 25 mg stated it made her feel dizzy, SOB, and have rapid heart beat, and Buspirone made her have palpitations in the past. Lexapro did work good for her. Patient has been off medication for anxiety and depression for about two months. Patient denies chest pain  Hypothyroidism:  Patient reports cold intolerance, hair loss, unintentional weight loss, and increased anxiety/depression. Patient currently taking synthroid 150 mcg.      03/07/2023    7:41 AM 12/27/2022   10:27 AM 11/20/2022   10:25 AM 01/04/2020    4:32 PM  Depression screen PHQ 2/9  Decreased Interest 1 2 0 0  Down, Depressed, Hopeless 1 1 1 1   PHQ - 2 Score 2 3 1 1   Altered sleeping 1 2 1 1   Tired, decreased energy 1 2 1 1   Change in appetite 1 2 0 0  Feeling bad or failure about yourself  1 0 0 0  Trouble concentrating 1 0 0 0  Moving slowly or fidgety/restless 0 0 0 0  Suicidal thoughts 0 0 0 0  PHQ-9 Score 7 9 3 3   Difficult doing work/chores Very difficult Somewhat difficult Somewhat difficult Somewhat difficult        03/07/2023    7:42 AM 12/27/2022   10:27 AM 11/20/2022   10:26 AM  GAD 7 : Generalized Anxiety Score  Nervous, Anxious, on Edge 3 2 1   Control/stop worrying 3 2 1   Worry too much - different things 3 2 1   Trouble relaxing 2 2 1   Restless 2 0 0  Easily annoyed or irritable 2 1 0  Afraid - awful might happen 3 1 0  Total GAD 7 Score 18 10 4   Anxiety Difficulty Very difficult Not difficult at all Somewhat difficult        03/07/2023    7:41 AM  Fall Risk   Falls in the past year? 0  Number falls in past yr: 0  Injury with Fall? 0  Risk for fall due to : No Fall Risks  Follow up Falls evaluation completed    Patient Care Team: Renne Crigler, FNP as PCP - General (Family Medicine)   Review of Systems  Constitutional:  Negative for fatigue.  HENT:  Negative for congestion, ear pain and sore throat.   Respiratory:  Negative for cough and shortness of breath.   Cardiovascular:  Negative for chest pain.  Gastrointestinal:  Negative for abdominal pain, constipation, diarrhea, nausea and vomiting.  Endocrine: Positive for cold intolerance.       Hair loss  Genitourinary:  Negative for dysuria, frequency and urgency.  Musculoskeletal:  Negative for arthralgias, back pain and myalgias.  Neurological:  Negative for dizziness and headaches.  Psychiatric/Behavioral:  Negative for agitation and sleep disturbance. The patient is nervous/anxious.     Current Outpatient Medications on File Prior to Visit  Medication Sig Dispense Refill   levothyroxine (SYNTHROID) 150 MCG tablet Take 1 tablet by mouth once daily 90 tablet 2   No current facility-administered medications on file prior to visit.   Past Medical History:  Diagnosis Date   Hypothyroidism due to medicaments and other exogenous substances    Major depressive disorder, single episode, moderate (HCC)  Palpitations    Past Surgical History:  Procedure Laterality Date   NO PAST SURGERIES      Family History  Problem Relation Age of Onset   Hypothyroidism Mother    Hypertension Father    Kidney Stones Father    Diabetes Father    Uterine cancer Paternal Grandmother    Lung cancer Paternal Grandfather    Social History   Socioeconomic History   Marital status: Married    Spouse name: Not on file   Number of children: Not on file   Years of education: Not on file   Highest education level: Not on file  Occupational History   Not on file  Tobacco Use   Smoking status: Never   Smokeless tobacco: Never  Vaping Use   Vaping status: Never Used  Substance and Sexual Activity   Alcohol use: Yes    Comment: Couple times a month   Drug use: Not Currently    Sexual activity: Not Currently    Partners: Male  Other Topics Concern   Not on file  Social History Narrative   Not on file   Social Determinants of Health   Financial Resource Strain: Not on file  Food Insecurity: Not on file  Transportation Needs: Not on file  Physical Activity: Not on file  Stress: Not on file  Social Connections: Not on file    Objective:  BP 122/80 (BP Location: Right Arm, Patient Position: Sitting, Cuff Size: Normal)   Pulse 90   Temp 97.7 F (36.5 C) (Temporal)   Ht 5\' 2"  (1.575 m)   Wt 165 lb 12.8 oz (75.2 kg)   SpO2 98%   BMI 30.33 kg/m      03/07/2023   11:54 AM 03/07/2023    7:38 AM 12/27/2022   10:20 AM  BP/Weight  Systolic BP 122 140 130  Diastolic BP 80 100 70  Wt. (Lbs)  165.8 172  BMI  30.33 kg/m2 31.46 kg/m2    Physical Exam Constitutional:      General: She is not in acute distress.    Appearance: Normal appearance.  HENT:     Head: Normocephalic.     Nose: No congestion.  Neck:     Vascular: No carotid bruit.  Cardiovascular:     Rate and Rhythm: Normal rate and regular rhythm.     Heart sounds: Normal heart sounds.  Pulmonary:     Effort: Pulmonary effort is normal.     Breath sounds: Normal breath sounds.  Skin:    General: Skin is warm.  Neurological:     Mental Status: She is alert. Mental status is at baseline.  Psychiatric:        Attention and Perception: Attention normal.        Mood and Affect: Mood is anxious.        Speech: Speech normal.        Behavior: Behavior is cooperative.        Lab Results  Component Value Date   WBC 10.2 07/18/2022   HGB 13.0 07/18/2022   HCT 40.2 07/18/2022   PLT 362 07/18/2022   GLUCOSE 85 07/18/2022   ALT 13 07/18/2022   AST 12 07/18/2022   NA 144 07/18/2022   K 4.4 07/18/2022   CL 106 07/18/2022   CREATININE 0.72 07/18/2022   BUN 12 07/18/2022   CO2 21 07/18/2022   TSH 14.300 (H) 07/18/2022      Assessment & Plan:    GAD (generalized  anxiety  disorder) Assessment & Plan: Patient not on any medications for anxiety in the last 2 months. Patient has tried Lexapro in the past and it worked for her.  Lexapro 5 mg by mouth daily for 2 weeks and then increase to 10 mg by mouth daily. Reviewed side effects   Orders: -     Escitalopram Oxalate; Take 1 tablet (10 mg total) by mouth daily.  Dispense: 90 tablet; Refill: 3  Primary hypothyroidism Assessment & Plan: Uncontrolled Currently symptomatic Taking 150 mcg synthroid TSH labs drawn today  Orders: -     TSH     Meds ordered this encounter  Medications   escitalopram (LEXAPRO) 10 MG tablet    Sig: Take 1 tablet (10 mg total) by mouth daily.    Dispense:  90 tablet    Refill:  3    Orders Placed This Encounter  Procedures   TSH     Follow-up: Return if symptoms worsen or fail to improve.   I,Jacqua L Marsh,acting as a scribe for Renne Crigler, FNP.,have documented all relevant documentation on the behalf of Renne Crigler, FNP,as directed by  Renne Crigler, FNP while in the presence of Renne Crigler, FNP.   An After Visit Summary was printed and given to the patient.  Renne Crigler, FNP [facility] [phone]

## 2023-03-07 NOTE — Patient Instructions (Addendum)
Start Lexapro 5 mg first 2 weeks and increase to 10 mg by mouth once daily at night. TSH level today I will adjust synthroid if needed after labs are resulted

## 2023-03-07 NOTE — Assessment & Plan Note (Signed)
Uncontrolled Currently symptomatic Taking 150 mcg synthroid TSH labs drawn today

## 2023-03-07 NOTE — Assessment & Plan Note (Addendum)
Patient not on any medications for anxiety in the last 2 months. Patient has tried Lexapro in the past and it worked for her.  Lexapro 5 mg by mouth daily for 2 weeks and then increase to 10 mg by mouth daily. Reviewed side effects

## 2023-03-08 ENCOUNTER — Other Ambulatory Visit: Payer: Self-pay | Admitting: Family Medicine

## 2023-03-08 DIAGNOSIS — E039 Hypothyroidism, unspecified: Secondary | ICD-10-CM

## 2023-03-08 LAB — TSH: TSH: 7.19 u[IU]/mL — ABNORMAL HIGH (ref 0.450–4.500)

## 2023-03-08 MED ORDER — LEVOTHYROXINE SODIUM 175 MCG PO TABS: 175.0000 ug | ORAL_TABLET | Freq: Every day | ORAL | 0 refills | Status: DC

## 2023-04-30 ENCOUNTER — Encounter: Payer: Self-pay | Admitting: Family Medicine

## 2023-05-01 ENCOUNTER — Other Ambulatory Visit: Payer: Self-pay | Admitting: Family Medicine

## 2023-05-01 MED ORDER — FLUCONAZOLE 150 MG PO TABS: 150.0000 mg | ORAL_TABLET | Freq: Every day | ORAL | 0 refills | Status: AC

## 2023-05-25 DIAGNOSIS — R1084 Generalized abdominal pain: Secondary | ICD-10-CM | POA: Diagnosis not present

## 2023-05-25 DIAGNOSIS — R109 Unspecified abdominal pain: Secondary | ICD-10-CM | POA: Diagnosis not present

## 2023-05-28 ENCOUNTER — Ambulatory Visit: Payer: Medicaid Other | Admitting: Family Medicine

## 2023-05-28 ENCOUNTER — Encounter: Payer: Self-pay | Admitting: Family Medicine

## 2023-05-28 VITALS — BP 130/90 | HR 90 | Temp 98.0°F | Resp 16 | Ht 62.0 in | Wt 165.8 lb

## 2023-05-28 DIAGNOSIS — R197 Diarrhea, unspecified: Secondary | ICD-10-CM | POA: Diagnosis not present

## 2023-05-28 DIAGNOSIS — Z1159 Encounter for screening for other viral diseases: Secondary | ICD-10-CM | POA: Diagnosis not present

## 2023-05-28 DIAGNOSIS — R030 Elevated blood-pressure reading, without diagnosis of hypertension: Secondary | ICD-10-CM

## 2023-05-28 DIAGNOSIS — E039 Hypothyroidism, unspecified: Secondary | ICD-10-CM

## 2023-05-28 DIAGNOSIS — R051 Acute cough: Secondary | ICD-10-CM | POA: Diagnosis not present

## 2023-05-28 DIAGNOSIS — Z23 Encounter for immunization: Secondary | ICD-10-CM | POA: Diagnosis not present

## 2023-05-28 DIAGNOSIS — D509 Iron deficiency anemia, unspecified: Secondary | ICD-10-CM | POA: Diagnosis not present

## 2023-05-28 DIAGNOSIS — R5383 Other fatigue: Secondary | ICD-10-CM

## 2023-05-28 DIAGNOSIS — Z114 Encounter for screening for human immunodeficiency virus [HIV]: Secondary | ICD-10-CM | POA: Diagnosis not present

## 2023-05-28 DIAGNOSIS — R1084 Generalized abdominal pain: Secondary | ICD-10-CM

## 2023-05-28 LAB — POC COVID19 BINAXNOW: SARS Coronavirus 2 Ag: NEGATIVE

## 2023-05-28 LAB — POCT INFLUENZA A/B
Influenza A, POC: NEGATIVE
Influenza B, POC: NEGATIVE

## 2023-05-28 NOTE — Assessment & Plan Note (Addendum)
Blood pressure initially high  162/98, but decreased on recheck 130/90. Patient reports feeling jittery and has recently started vaping. -Continue to monitor blood pressure.

## 2023-05-28 NOTE — Patient Instructions (Signed)
 Aim to do some physical activity for 150 minutes per week. This is typically divided into 5 days per week, 30 minutes per day. The activity should be enough to get your heart rate up. Anything is better than nothing if you have time constraints.

## 2023-05-28 NOTE — Assessment & Plan Note (Signed)
Labs drawn  Await labs/testing for assessment and recommendations  -Encouraged to drink plenty of water and rest as much as possible Referral to GI

## 2023-05-28 NOTE — Assessment & Plan Note (Addendum)
Reports of excessive tiredness and oversleeping since the weekend. -Perform pregnancy test due to patient's desire for another child and lack of contraceptive use. -Check thyroid levels as patient is on levothyroxine and previous levels were elevated. TSH - 7.190 -Currently taking levothyroxine 175 mg by mouth in the morning prior to breakfast on an empty stomach. Await labs/testing for assessment and recommendations

## 2023-05-28 NOTE — Assessment & Plan Note (Addendum)
Perform flu and COVID tests due to recent onset of symptoms and exposure to sick individual. Flu and Covid - negative - Try OTC cough medication and cough drops - Try lemon and honey

## 2023-05-28 NOTE — Assessment & Plan Note (Signed)
Labs drawn Await labs/testing for assessment and recommendations

## 2023-05-28 NOTE — Progress Notes (Unsigned)
Acute Office Visit  Subjective:    Patient ID: Rhonda Shah, female    DOB: Nov 07, 1995, 27 y.o.   MRN: [id number]  Chief Complaint  Patient presents with   Abdominal Pain    HPI: The patient, with a history of hypothyroidism, presents with ongoing abdominal pain and fatigue. The abdominal pain started on Friday, described as a consistent ache located in the upper abdomen. The patient sought care at an urgent care center where she was told it might be trapped gas. The pain has not resolved since then. The patient also reports feeling unusually tired, even after a full night's sleep. This fatigue started around the same time as the abdominal pain. The patient also reports frequent urination, headaches, and a recent episode of diarrhea. The patient denies any constipation, lightheadedness, or dizziness. The patient also mentions a recent high blood pressure reading, but is unsure of the cause. The patient has started taking a multivitamin for fertility purposes and is considering pregnancy. The patient's last menstrual cycle was slightly longer than usual.   Past Medical History:  Diagnosis Date   Hypothyroidism due to medicaments and other exogenous substances    Major depressive disorder, single episode, moderate (HCC)    Palpitations     Past Surgical History:  Procedure Laterality Date   NO PAST SURGERIES      Family History  Problem Relation Age of Onset   Hypothyroidism Mother    Hypertension Father    Kidney Stones Father    Diabetes Father    Uterine cancer Paternal Grandmother    Lung cancer Paternal Grandfather     Social History   Socioeconomic History   Marital status: Married    Spouse name: Not on file   Number of children: Not on file   Years of education: Not on file   Highest education level: Not on file  Occupational History   Not on file  Tobacco Use   Smoking status: Never   Smokeless tobacco: Never  Vaping Use   Vaping status: Never Used   Substance and Sexual Activity   Alcohol use: Yes    Comment: Couple times a month   Drug use: Not Currently   Sexual activity: Not Currently    Partners: Male  Other Topics Concern   Not on file  Social History Narrative   Not on file   Social Determinants of Health   Financial Resource Strain: Not on file  Food Insecurity: Not on file  Transportation Needs: Not on file  Physical Activity: Not on file  Stress: Not on file  Social Connections: Not on file  Intimate Partner Violence: Not on file    Outpatient Medications Prior to Visit  Medication Sig Dispense Refill   escitalopram (LEXAPRO) 10 MG tablet Take 1 tablet (10 mg total) by mouth daily. 90 tablet 3   levothyroxine (SYNTHROID) 175 MCG tablet Take 1 tablet (175 mcg total) by mouth daily. 90 tablet 0   No facility-administered medications prior to visit.    Allergies  Allergen Reactions   Amoxicillin-Pot Clavulanate Itching   Buspirone     palpitations    Review of Systems  Constitutional:  Positive for chills and fatigue.       Loss of appetite  HENT:  Positive for congestion, rhinorrhea and sneezing. Negative for ear pain, sinus pressure, sinus pain, sore throat, tinnitus and trouble swallowing.   Eyes:  Negative for pain.  Respiratory:  Positive for cough. Negative for chest tightness, shortness of  breath and wheezing.   Cardiovascular:  Negative for chest pain.  Gastrointestinal:  Positive for abdominal pain, diarrhea and nausea.  Genitourinary:  Positive for frequency. Negative for dysuria and urgency.  Musculoskeletal:  Negative for arthralgias and myalgias.  Skin:  Negative for rash.  Neurological:  Positive for headaches. Negative for light-headedness.  Psychiatric/Behavioral:  Positive for sleep disturbance. Negative for dysphoric mood. The patient is not nervous/anxious.        Objective:        05/28/2023    3:13 PM 05/28/2023    2:50 PM 03/07/2023   11:54 AM  Vitals with BMI  Height  5'  2"   Weight  165 lbs 13 oz   BMI  30.32   Systolic 130 162 657  Diastolic 90 98 80  Pulse  90     Orthostatic VS for the past 72 hrs (Last 3 readings):  Patient Position BP Location  05/28/23 1513 Sitting Left Arm     Physical Exam Vitals reviewed.  Constitutional:      General: She is not in acute distress.    Appearance: She is not ill-appearing.  HENT:     Head: Normocephalic.  Cardiovascular:     Rate and Rhythm: Normal rate and regular rhythm.     Heart sounds: Normal heart sounds.  Pulmonary:     Effort: Pulmonary effort is normal.     Breath sounds: No wheezing.  Abdominal:     General: Bowel sounds are normal.     Palpations: Abdomen is soft.     Tenderness: There is generalized abdominal tenderness.  Skin:    General: Skin is warm.  Neurological:     Mental Status: She is alert and oriented to person, place, and time.  Psychiatric:        Mood and Affect: Mood normal. Mood is not anxious.     Health Maintenance Due  Topic Date Due   HIV Screening  Never done   Hepatitis C Screening  Never done   DTaP/Tdap/Td (1 - Tdap) Never done   Cervical Cancer Screening (Pap smear)  Never done   INFLUENZA VACCINE  Never done   COVID-19 Vaccine (3 - 2023-24 season) 03/10/2023    There are no preventive care reminders to display for this patient.   Lab Results  Component Value Date   TSH 7.190 (H) 03/07/2023   Lab Results  Component Value Date   WBC 10.2 07/18/2022   HGB 13.0 07/18/2022   HCT 40.2 07/18/2022   MCV 85 07/18/2022   PLT 362 07/18/2022   Lab Results  Component Value Date   NA 144 07/18/2022   K 4.4 07/18/2022   CO2 21 07/18/2022   GLUCOSE 85 07/18/2022   BUN 12 07/18/2022   CREATININE 0.72 07/18/2022   BILITOT <0.2 07/18/2022   ALKPHOS 73 07/18/2022   AST 12 07/18/2022   ALT 13 07/18/2022   PROT 7.0 07/18/2022   ALBUMIN 4.2 07/18/2022   CALCIUM 8.8 07/18/2022   EGFR 117 07/18/2022   No results found for: "CHOL" No results found  for: "HDL" No results found for: "LDLCALC" No results found for: "TRIG" No results found for: "CHOLHDL" No results found for: "HGBA1C"     Assessment & Plan:  Generalized abdominal pain Assessment & Plan: Labs drawn Await labs/testing for assessment and recommendations   Orders: -     H.pylori screen, POC  Acute cough Assessment & Plan: Perform flu and COVID tests due to recent onset  of symptoms and exposure to sick individual. Flu and Covid - negative - Try OTC cough medication and cough drops - Try lemon and honey   Orders: -     POC COVID-19 BinaxNow -     POCT Influenza A/B  Other fatigue Assessment & Plan: Reports of excessive tiredness and oversleeping since the weekend. -Perform pregnancy test due to patient's desire for another child and lack of contraceptive use. -Check thyroid levels as patient is on levothyroxine and previous levels were elevated. TSH - 7.190 -Currently taking levothyroxine 175 mg by mouth in the morning prior to breakfast on an empty stomach. Await labs/testing for assessment and recommendations   Orders: -     hCG, quantitative, pregnancy -     VITAMIN D 25 Hydroxy (Vit-D Deficiency, Fractures) -     CBC with Differential/Platelet -     Comprehensive metabolic panel  Elevated blood pressure reading without diagnosis of hypertension Assessment & Plan: Blood pressure initially high  162/98, but decreased on recheck 130/90. Patient reports feeling jittery and has recently started vaping. -Continue to monitor blood pressure.    Primary hypothyroidism -     TSH -     T4, free  Diarrhea, unspecified type Assessment & Plan: Labs drawn  Await labs/testing for assessment and recommendations  -Encouraged to drink plenty of water and rest as much as possible  Orders: -     Cdiff NAA+O+P+Stool Culture  Encounter for hepatitis C screening test for low risk patient -     Hepatitis C antibody  Encounter for screening for HIV -     HIV  Antibody (routine testing w rflx)  Need for vaccination -     Tdap vaccine greater than or equal to 7yo IM     No orders of the defined types were placed in this encounter.   Orders Placed This Encounter  Procedures   Cdiff NAA+O+P+Stool Culture   Tdap vaccine greater than or equal to 7yo IM   TSH   T4, Free   hCG, quantitative, pregnancy   Vitamin D, 25-hydroxy   CBC with Differential   Comprehensive metabolic panel   HIV Antibody (routine testing w rflx)   Hepatitis C antibody   POC COVID-19   Influenza A/B   H.pylori screen, POC     Follow-up: Return in about 3 months (around 08/28/2023) for chronic.  An After Visit Summary was printed and given to the patient.  Total time spent on today's visit was greater than 30 minutes, including both face-to-face time and nonface-to-face time personally spent on review of chart (labs and imaging), discussing labs and goals, discussing further work-up, treatment options, referrals to specialist if needed, reviewing outside records if pertinent, answering patient's questions, and coordinating care.   Lajuana Matte, FNP [facility] [phone]

## 2023-05-29 ENCOUNTER — Other Ambulatory Visit: Payer: Self-pay | Admitting: Family Medicine

## 2023-05-29 DIAGNOSIS — E559 Vitamin D deficiency, unspecified: Secondary | ICD-10-CM

## 2023-05-29 LAB — CBC WITH DIFFERENTIAL/PLATELET
Basophils Absolute: 0 10*3/uL (ref 0.0–0.2)
Basos: 1 %
EOS (ABSOLUTE): 0 10*3/uL (ref 0.0–0.4)
Eos: 1 %
Hematocrit: 36.3 % (ref 34.0–46.6)
Hemoglobin: 11.1 g/dL (ref 11.1–15.9)
Immature Grans (Abs): 0 10*3/uL (ref 0.0–0.1)
Immature Granulocytes: 0 %
Lymphocytes Absolute: 1.4 10*3/uL (ref 0.7–3.1)
Lymphs: 24 %
MCH: 25.1 pg — ABNORMAL LOW (ref 26.6–33.0)
MCHC: 30.6 g/dL — ABNORMAL LOW (ref 31.5–35.7)
MCV: 82 fL (ref 79–97)
Monocytes Absolute: 0.5 10*3/uL (ref 0.1–0.9)
Monocytes: 8 %
Neutrophils Absolute: 4 10*3/uL (ref 1.4–7.0)
Neutrophils: 66 %
Platelets: 427 10*3/uL (ref 150–450)
RBC: 4.42 x10E6/uL (ref 3.77–5.28)
RDW: 14.7 % (ref 11.7–15.4)
WBC: 6 10*3/uL (ref 3.4–10.8)

## 2023-05-29 LAB — HIV ANTIBODY (ROUTINE TESTING W REFLEX): HIV Screen 4th Generation wRfx: NONREACTIVE

## 2023-05-29 LAB — HEPATITIS C ANTIBODY: Hep C Virus Ab: NONREACTIVE

## 2023-05-29 LAB — COMPREHENSIVE METABOLIC PANEL
ALT: 12 [IU]/L (ref 0–32)
AST: 15 [IU]/L (ref 0–40)
Albumin: 4.6 g/dL (ref 4.0–5.0)
Alkaline Phosphatase: 69 [IU]/L (ref 44–121)
BUN/Creatinine Ratio: 23 (ref 9–23)
BUN: 12 mg/dL (ref 6–20)
Bilirubin Total: 0.2 mg/dL (ref 0.0–1.2)
CO2: 22 mmol/L (ref 20–29)
Calcium: 9.3 mg/dL (ref 8.7–10.2)
Chloride: 106 mmol/L (ref 96–106)
Creatinine, Ser: 0.53 mg/dL — ABNORMAL LOW (ref 0.57–1.00)
Globulin, Total: 2.6 g/dL (ref 1.5–4.5)
Glucose: 68 mg/dL — ABNORMAL LOW (ref 70–99)
Potassium: 4.7 mmol/L (ref 3.5–5.2)
Sodium: 141 mmol/L (ref 134–144)
Total Protein: 7.2 g/dL (ref 6.0–8.5)
eGFR: 128 mL/min/{1.73_m2} (ref >59)

## 2023-05-29 LAB — T4, FREE: Free T4: 1.7 ng/dL (ref 0.82–1.77)

## 2023-05-29 LAB — VITAMIN D 25 HYDROXY (VIT D DEFICIENCY, FRACTURES): Vit D, 25-Hydroxy: 18.6 ng/mL — ABNORMAL LOW (ref 30.0–100.0)

## 2023-05-29 LAB — TSH: TSH: 0.69 u[IU]/mL (ref 0.450–4.500)

## 2023-05-29 MED ORDER — VITAMIN D (ERGOCALCIFEROL) 1.25 MG (50000 UNIT) PO CAPS: 50000.0000 [IU] | ORAL_CAPSULE | ORAL | 0 refills | Status: AC

## 2023-05-29 NOTE — Assessment & Plan Note (Signed)
Thyroid therapeutic despite fatigue Continue to take levothyroxine 175 mcg by mouth once daily before breakfast on an empty stomach 30 -60 minutes before food intake. TSH - 0.690 T4 - 1.70 FU in 3 months

## 2023-05-30 LAB — B12 AND FOLATE PANEL
Folate: 4.5 ng/mL (ref >3.0)
Vitamin B-12: 275 pg/mL (ref 232–1245)

## 2023-05-30 LAB — SPECIMEN STATUS REPORT

## 2023-06-03 ENCOUNTER — Ambulatory Visit: Payer: Medicaid Other

## 2023-06-03 DIAGNOSIS — R1084 Generalized abdominal pain: Secondary | ICD-10-CM | POA: Diagnosis not present

## 2023-06-03 DIAGNOSIS — R197 Diarrhea, unspecified: Secondary | ICD-10-CM | POA: Diagnosis not present

## 2023-06-04 LAB — H. PYLORI BREATH TEST: H pylori Breath Test: NEGATIVE

## 2023-06-07 LAB — CDIFF NAA+O+P+STOOL CULTURE
E coli, Shiga toxin Assay: NEGATIVE
Toxigenic C. Difficile by PCR: NEGATIVE

## 2023-06-27 ENCOUNTER — Other Ambulatory Visit: Payer: Self-pay | Admitting: Family Medicine

## 2023-06-27 DIAGNOSIS — E039 Hypothyroidism, unspecified: Secondary | ICD-10-CM

## 2023-07-21 DIAGNOSIS — R6889 Other general symptoms and signs: Secondary | ICD-10-CM | POA: Diagnosis not present

## 2023-07-21 DIAGNOSIS — R509 Fever, unspecified: Secondary | ICD-10-CM | POA: Diagnosis not present

## 2023-07-22 ENCOUNTER — Ambulatory Visit: Payer: Medicaid Other

## 2023-07-22 VITALS — BP 116/88 | HR 115 | Temp 98.2°F | Ht 62.0 in | Wt 168.4 lb

## 2023-07-22 DIAGNOSIS — J029 Acute pharyngitis, unspecified: Secondary | ICD-10-CM | POA: Diagnosis not present

## 2023-07-22 DIAGNOSIS — J020 Streptococcal pharyngitis: Secondary | ICD-10-CM

## 2023-07-22 LAB — POCT RAPID STREP A (OFFICE): Rapid Strep A Screen: POSITIVE — AB

## 2023-07-22 MED ORDER — AZITHROMYCIN 250 MG PO TABS: ORAL_TABLET | ORAL | 0 refills | Status: AC

## 2023-07-22 NOTE — Patient Instructions (Signed)
 Positive strep throat Sent antibiotic to the pharmacy

## 2023-07-22 NOTE — Progress Notes (Signed)
 Acute Office Visit  Subjective:    Patient ID: Rhonda Shah, female    DOB: September 27, 1995, 28 y.o.   MRN: [id number]  Chief Complaint  Patient presents with   Sore Throat    Discussed the use of AI scribe software for clinical note transcription with the patient, who gave verbal consent to proceed.      HPI: Patient is in today for sore throat, body aches, chills, headaches, cough and fever. All started Friday. Patient went to urgent care yesterday and they tested her for covid and flu. Both rapid test came back negative. The patient presented with a chief complaint of a high-grade fever of 102.38F 2 days ago, which persisted despite medication. Accompanying symptoms included body aches, chills, and a sore throat, which progressively worsened to the point of feeling like swallowing razor blades. The patient also reported a cough, with more pronounced pain on one side of the throat. The patient had previously sought care at an urgent care center, where she was tested for the flu. The rapid test was negative, but the provider suggested a flu-like virus might be the cause. The patient reported no relief from symptoms with the use of over-the-counter medication. The patient also noted a sensation of coldness in the eyes and a general feeling of malaise.    Past Medical History:  Diagnosis Date   Hypothyroidism due to medicaments and other exogenous substances    Major depressive disorder, single episode, moderate (HCC)    Palpitations     Past Surgical History:  Procedure Laterality Date   NO PAST SURGERIES      Family History  Problem Relation Age of Onset   Hypothyroidism Mother    Hypertension Father    Kidney Stones Father    Diabetes Father    Uterine cancer Paternal Grandmother    Lung cancer Paternal Grandfather     Social History   Socioeconomic History   Marital status: Married    Spouse name: Not on file   Number of children: Not on file   Years of education:  Not on file   Highest education level: Not on file  Occupational History   Not on file  Tobacco Use   Smoking status: Never   Smokeless tobacco: Never  Vaping Use   Vaping status: Never Used  Substance and Sexual Activity   Alcohol use: Yes    Comment: Couple times a month   Drug use: Not Currently   Sexual activity: Not Currently    Partners: Male  Other Topics Concern   Not on file  Social History Narrative   Not on file   Social Drivers of Health   Financial Resource Strain: Low Risk  (07/22/2023)   Overall Financial Resource Strain (CARDIA)    Difficulty of Paying Living Expenses: Not hard at all  Food Insecurity: No Food Insecurity (07/22/2023)   Hunger Vital Sign    Worried About Running Out of Food in the Last Year: Never true    Ran Out of Food in the Last Year: Never true  Transportation Needs: No Transportation Needs (07/22/2023)   PRAPARE - Administrator, Civil Service (Medical): No    Lack of Transportation (Non-Medical): No  Physical Activity: Sufficiently Active (07/22/2023)   Exercise Vital Sign    Days of Exercise per Week: 7 days    Minutes of Exercise per Session: 30 min  Stress: No Stress Concern Present (07/22/2023)   [employer] of Occupational Health - Occupational  Stress Questionnaire    Feeling of Stress : Not at all  Social Connections: Moderately Integrated (07/22/2023)   Social Connection and Isolation Panel [NHANES]    Frequency of Communication with Friends and Family: More than three times a week    Frequency of Social Gatherings with Friends and Family: More than three times a week    Attends Religious Services: More than 4 times per year    Active Member of [employer] or Organizations: Yes    Attends Banker Meetings: 1 to 4 times per year    Marital Status: Divorced  Intimate Partner Violence: Not At Risk (07/22/2023)   Humiliation, Afraid, Rape, and Kick questionnaire    Fear of Current or Ex-Partner: No     Emotionally Abused: No    Physically Abused: No    Sexually Abused: No    Outpatient Medications Prior to Visit  Medication Sig Dispense Refill   escitalopram  (LEXAPRO ) 10 MG tablet Take 1 tablet (10 mg total) by mouth daily. 90 tablet 3   levothyroxine  (SYNTHROID ) 175 MCG tablet Take 1 tablet by mouth once daily 90 tablet 0   Vitamin D , Ergocalciferol , (DRISDOL ) 1.25 MG (50000 UNIT) CAPS capsule Take 1 capsule (50,000 Units total) by mouth every 7 (seven) days for 12 doses. 12 capsule 0   No facility-administered medications prior to visit.    Allergies  Allergen Reactions   Amoxicillin-Pot Clavulanate Itching   Buspirone      palpitations    Review of Systems  Constitutional:  Positive for chills and fever. Negative for appetite change and fatigue.  HENT:  Positive for congestion and sore throat. Negative for ear discharge, ear pain, rhinorrhea, sinus pressure and sneezing.   Eyes:  Negative for visual disturbance.  Respiratory:  Negative for cough, chest tightness, shortness of breath and wheezing.   Cardiovascular:  Negative for chest pain, palpitations and leg swelling.  Gastrointestinal:  Negative for abdominal pain, diarrhea, nausea and vomiting.  Endocrine: Negative for polydipsia, polyphagia and polyuria.  Genitourinary:  Negative for difficulty urinating, dysuria, frequency, hematuria, menstrual problem, urgency, vaginal bleeding, vaginal discharge and vaginal pain.  Musculoskeletal:  Negative for back pain, gait problem, joint swelling, myalgias and neck pain.  Neurological:  Positive for headaches. Negative for dizziness, seizures, syncope, weakness and numbness.  Psychiatric/Behavioral:  Negative for agitation, confusion, hallucinations, sleep disturbance and suicidal ideas. The patient is not nervous/anxious.        Objective:        07/22/2023   10:25 AM 05/28/2023    3:13 PM 05/28/2023    2:50 PM  Vitals with BMI  Height 5' 2  5' 2  Weight 168 lbs 6 oz   165 lbs 13 oz  BMI 30.79  30.32  Systolic 116 130 837  Diastolic 88 90 98  Pulse 115  90    No data found.   Physical Exam Vitals and nursing note reviewed.  Constitutional:      Appearance: She is well-developed.  HENT:     Head: Normocephalic and atraumatic.     Comments: HEENT: Eardrums normal, no wax, no redness, no infection. Eyes normal upon inspection. Nasal mucosa swollen,   Reddish enlarged tonsils with white exudate in posterior pharynx.  NECK: Bilateral cervical lymph nodes swollen. CARDIOVASCULAR: Heart rate elevated.  Cardiovascular:     Rate and Rhythm: Normal rate and regular rhythm.  Pulmonary:     Effort: Pulmonary effort is normal.     Breath sounds: Normal breath sounds.  Neurological:     Mental Status: She is alert.     Health Maintenance Due  Topic Date Due   Cervical Cancer Screening (Pap smear)  Never done    There are no preventive care reminders to display for this patient.   Lab Results  Component Value Date   TSH 0.690 05/28/2023   Lab Results  Component Value Date   WBC 6.0 05/28/2023   HGB 11.1 05/28/2023   HCT 36.3 05/28/2023   MCV 82 05/28/2023   PLT 427 05/28/2023   Lab Results  Component Value Date   NA 141 05/28/2023   K 4.7 05/28/2023   CO2 22 05/28/2023   GLUCOSE 68 (L) 05/28/2023   BUN 12 05/28/2023   CREATININE 0.53 (L) 05/28/2023   BILITOT 0.2 05/28/2023   ALKPHOS 69 05/28/2023   AST 15 05/28/2023   ALT 12 05/28/2023   PROT 7.2 05/28/2023   ALBUMIN 4.6 05/28/2023   CALCIUM 9.3 05/28/2023   EGFR 128 05/28/2023   No results found for: CHOL No results found for: HDL No results found for: LDLCALC No results found for: TRIG No results found for: CHOLHDL No results found for: [id number]     Assessment & Plan:  Strep pharyngitis Assessment & Plan: Acute onset of severe sore throat with red throat, white exudate, and swollen cervical lymph nodes. Differential includes bacterial (likely  streptococcal) and viral pharyngitis. Rapid strep test negative; culture pending. Discussed importance of diagnosing and treating bacterial pharyngitis with antibiotics to prevent complications. Explained rapid strep tests can yield false negatives; culture is more definitive. - Perform throat swab for culture- RAPID STREP POSITIVE. SINCE SHE IS ALLERGIC TO AUGMENTIN, SENT AZITHROMYCIN  TO HER PHARMACY. - Continue acetaminophen and ibuprofen for fever and pain management - Recommend salt water gargles - Suggest chloraseptic spray for throat pain relief   Orders: -     POCT rapid strep A  Sore throat Assessment & Plan: Persistent fever (102.24F) not fully responsive to antipyretics, associated with body aches, chills, and malaise. Likely secondary to underlying infection. Discussed importance of monitoring temperature and continuing antipyretics. - Monitor temperature regularly - Continue acetaminophen and ibuprofen for fever management  Upper Respiratory Infection Symptoms include dry nose, cough, and nasal congestion. Physical exam shows swollen nasal mucous membranes. Likely viral etiology given negative rapid flu test and current presentation. Discussed supportive care measures, hydration, and rest. - Encourage hydration - Advise rest and supportive care - Monitor for worsening symptoms or new developments  Follow-up - Follow up once throat culture results are available.   Other orders -     Azithromycin ; Take 2 tablets on day 1, then 1 tablet daily on days 2 through 5  Dispense: 6 tablet; Refill: 0     Meds ordered this encounter  Medications   azithromycin  (ZITHROMAX ) 250 MG tablet    Sig: Take 2 tablets on day 1, then 1 tablet daily on days 2 through 5    Dispense:  6 tablet    Refill:  0    Orders Placed This Encounter  Procedures   Rapid Strep A     Follow-up: Return if symptoms worsen or fail to improve.  An After Visit Summary was printed and given to the  patient.  Kaimen Peine, MD [facility] [phone]

## 2023-07-22 NOTE — Assessment & Plan Note (Signed)
 Persistent fever (102.64F) not fully responsive to antipyretics, associated with body aches, chills, and malaise. Likely secondary to underlying infection. Discussed importance of monitoring temperature and continuing antipyretics. - Monitor temperature regularly - Continue acetaminophen and ibuprofen for fever management  Upper Respiratory Infection Symptoms include dry nose, cough, and nasal congestion. Physical exam shows swollen nasal mucous membranes. Likely viral etiology given negative rapid flu test and current presentation. Discussed supportive care measures, hydration, and rest. - Encourage hydration - Advise rest and supportive care - Monitor for worsening symptoms or new developments  Follow-up - Follow up once throat culture results are available.

## 2023-07-22 NOTE — Assessment & Plan Note (Addendum)
 Acute onset of severe sore throat with red throat, white exudate, and swollen cervical lymph nodes. Differential includes bacterial (likely streptococcal) and viral pharyngitis. Rapid strep test negative; culture pending. Discussed importance of diagnosing and treating bacterial pharyngitis with antibiotics to prevent complications. Explained rapid strep tests can yield false negatives; culture is more definitive. - Perform throat swab for culture- RAPID STREP POSITIVE. SINCE SHE IS ALLERGIC TO AUGMENTIN, SENT AZITHROMYCIN  TO HER PHARMACY. - Continue acetaminophen and ibuprofen for fever and pain management - Recommend salt water gargles - Suggest chloraseptic spray for throat pain relief

## 2023-07-30 ENCOUNTER — Encounter: Payer: Self-pay | Admitting: Family Medicine

## 2023-07-30 ENCOUNTER — Ambulatory Visit: Payer: Medicaid Other | Admitting: Family Medicine

## 2023-07-30 VITALS — BP 110/76 | HR 76 | Temp 98.5°F | Resp 16 | Ht 62.0 in | Wt 174.6 lb

## 2023-07-30 DIAGNOSIS — J020 Streptococcal pharyngitis: Secondary | ICD-10-CM | POA: Diagnosis not present

## 2023-07-30 DIAGNOSIS — R590 Localized enlarged lymph nodes: Secondary | ICD-10-CM | POA: Diagnosis not present

## 2023-07-30 MED ORDER — CLINDAMYCIN HCL 300 MG PO CAPS: 300.0000 mg | ORAL_CAPSULE | Freq: Three times a day (TID) | ORAL | 0 refills | Status: AC

## 2023-07-30 NOTE — Assessment & Plan Note (Signed)
Lymphadenopathy Painful cervical lymph nodes on the right side, possibly related to recent strep throat infection. No other systemic symptoms. -Obtained throat culture to identify potential pathogens. -Continue warm compresses and over-the-counter analgesics for pain relief. -Clindamycin 300 mg by mouth THREE TIMES A DAY for 10 days

## 2023-07-30 NOTE — Progress Notes (Signed)
Subjective:  Patient ID: Rhonda Shah, female    DOB: 1995/10/15  Age: 28 y.o. MRN: [id number]  Chief Complaint  Patient presents with   Sore Throat   Discussed the use of AI scribe software for clinical note transcription with the patient, who gave verbal consent to proceed.   HPI  The patient, with a history of recent strep throat, presents with persistent lymphadenopathy. They initially sought care at an urgent care center for symptoms of cough, sneezing, body aches, and a high fever. They were diagnosed with a flu-like illness. However, the next day, they developed severe throat pain and were subsequently diagnosed with strep throat. They were treated with antibiotics, which improved their symptoms, but they continue to experience discomfort from swollen lymph nodes. The lymphadenopathy is localized to the right side of their neck and is described as painful and annoying, sometimes radiating to the ear. They also report residual symptoms of sneezing, coughing, and a runny nose. They deny any fever, chest pain, shortness of breath, or GI symptoms. 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     07/22/2023   10:29 AM 03/07/2023    7:41 AM 12/27/2022   10:27 AM 11/20/2022   10:25 AM 01/04/2020    4:32 PM  Depression screen PHQ 2/9  Decreased Interest 0 1 2 0 0  Down, Depressed, Hopeless 0 1 1 1 1   PHQ - 2 Score 0 2 3 1 1   Altered sleeping  1 2 1 1   Tired, decreased energy  1 2 1 1   Change in appetite  1 2 0 0  Feeling bad or failure about yourself   1 0 0 0  Trouble concentrating  1 0 0 0  Moving slowly or fidgety/restless  0 0 0 0  Suicidal thoughts  0 0 0 0  PHQ-9 Score  7 9 3 3   Difficult doing work/chores  Very difficult Somewhat difficult Somewhat difficult Somewhat difficult        03/07/2023    7:41 AM  Fall Risk   Falls in the past year? 0  Number falls in past yr: 0  Injury with Fall? 0  Risk for fall due to : No Fall Risks  Follow up Falls  evaluation completed    Patient Care Team: Renne Crigler, FNP as PCP - General (Family Medicine)   Review of Systems  Constitutional:  Negative for fever.  HENT:  Positive for congestion, rhinorrhea, sneezing and sore throat (right lymph node swollen).        Painful to swallow   Respiratory:  Positive for cough. Negative for shortness of breath.   Cardiovascular:  Negative for chest pain.  Gastrointestinal: Negative.   Genitourinary: Negative.   Musculoskeletal:  Positive for neck pain.  Neurological:  Negative for headaches.  Hematological:  Positive for adenopathy (right side of her neck).    Current Outpatient Medications on File Prior to Visit  Medication Sig Dispense Refill   escitalopram (LEXAPRO) 10 MG tablet Take 1 tablet (10 mg total) by mouth daily. 90 tablet 3   levothyroxine (SYNTHROID) 175 MCG tablet Take 1 tablet by mouth once daily 90 tablet 0   Vitamin D, Ergocalciferol, (DRISDOL) 1.25 MG (50000 UNIT) CAPS capsule Take 1 capsule (50,000 Units total) by mouth every 7 (seven) days for 12 doses. 12 capsule 0   No current facility-administered medications on file prior to visit.   Past Medical History:  Diagnosis Date   Hypothyroidism  due to medicaments and other exogenous substances    Major depressive disorder, single episode, moderate (HCC)    Palpitations    Past Surgical History:  Procedure Laterality Date   NO PAST SURGERIES      Family History  Problem Relation Age of Onset   Hypothyroidism Mother    Hypertension Father    Kidney Stones Father    Diabetes Father    Uterine cancer Paternal Grandmother    Lung cancer Paternal Grandfather    Social History   Socioeconomic History   Marital status: Married    Spouse name: Not on file   Number of children: Not on file   Years of education: Not on file   Highest education level: Not on file  Occupational History   Not on file  Tobacco Use   Smoking status: Never   Smokeless tobacco: Never   Vaping Use   Vaping status: Never Used  Substance and Sexual Activity   Alcohol use: Yes    Comment: Couple times a month   Drug use: Not Currently   Sexual activity: Not Currently    Partners: Male  Other Topics Concern   Not on file  Social History Narrative   Not on file   Social Drivers of Health   Financial Resource Strain: Low Risk  (07/22/2023)   Overall Financial Resource Strain (CARDIA)    Difficulty of Paying Living Expenses: Not hard at all  Food Insecurity: No Food Insecurity (07/22/2023)   Hunger Vital Sign    Worried About Running Out of Food in the Last Year: Never true    Ran Out of Food in the Last Year: Never true  Transportation Needs: No Transportation Needs (07/22/2023)   PRAPARE - Administrator, Civil Service (Medical): No    Lack of Transportation (Non-Medical): No  Physical Activity: Sufficiently Active (07/22/2023)   Exercise Vital Sign    Days of Exercise per Week: 7 days    Minutes of Exercise per Session: 30 min  Stress: No Stress Concern Present (07/22/2023)   [employer] of Occupational Health - Occupational Stress Questionnaire    Feeling of Stress : Not at all  Social Connections: Moderately Integrated (07/22/2023)   Social Connection and Isolation Panel [NHANES]    Frequency of Communication with Friends and Family: More than three times a week    Frequency of Social Gatherings with Friends and Family: More than three times a week    Attends Religious Services: More than 4 times per year    Active Member of [employer] or Organizations: Yes    Attends Banker Meetings: 1 to 4 times per year    Marital Status: Divorced    Objective:  BP 110/76 (BP Location: Right Arm, Patient Position: Sitting, Cuff Size: Large)   Pulse 76   Temp 98.5 F (36.9 C)   Resp 16   Ht 5\' 2"  (1.575 m)   Wt 174 lb 9.6 oz (79.2 kg)   LMP 07/08/2023   SpO2 100%   BMI 31.93 kg/m      07/30/2023    4:09 PM 07/22/2023   10:25 AM  05/28/2023    3:13 PM  BP/Weight  Systolic BP 110 116 130  Diastolic BP 76 88 90  Wt. (Lbs) 174.6 168.4   BMI 31.93 kg/m2 30.8 kg/m2     Physical Exam Vitals reviewed.  Constitutional:      General: She is not in acute distress.    Appearance: She  is not ill-appearing.  HENT:     Head: Normocephalic.     Right Ear: Tenderness present.     Nose: Congestion present.     Mouth/Throat:     Pharynx: Posterior oropharyngeal erythema present.  Cardiovascular:     Heart sounds: Normal heart sounds. No murmur heard. Pulmonary:     Effort: Pulmonary effort is normal.     Breath sounds: Normal breath sounds. No wheezing.  Lymphadenopathy:     Head:     Right side of head: Submental and tonsillar adenopathy present.     Cervical: Cervical adenopathy present.  Neurological:     Mental Status: She is alert.     Lab Results  Component Value Date   WBC 6.0 05/28/2023   HGB 11.1 05/28/2023   HCT 36.3 05/28/2023   PLT 427 05/28/2023   GLUCOSE 68 (L) 05/28/2023   ALT 12 05/28/2023   AST 15 05/28/2023   NA 141 05/28/2023   K 4.7 05/28/2023   CL 106 05/28/2023   CREATININE 0.53 (L) 05/28/2023   BUN 12 05/28/2023   CO2 22 05/28/2023   TSH 0.690 05/28/2023      Assessment & Plan:    Lymphadenopathy, submental Assessment & Plan: Lymphadenopathy Painful cervical lymph nodes on the right side, possibly related to recent strep throat infection. No other systemic symptoms. -Obtained throat culture to identify potential pathogens. -Continue warm compresses and over-the-counter analgesics for pain relief. -Clindamycin 300 mg by mouth THREE TIMES A DAY for 10 days  Orders: -     Upper Respiratory Culture, Routine -     Clindamycin HCl; Take 1 capsule (300 mg total) by mouth 3 (three) times daily for 10 days.  Dispense: 30 capsule; Refill: 0  Strep pharyngitis Assessment & Plan: Residual symptoms of sneezing, coughing, swollen lymph nodes and runny nose following recent strep  throat infection and treatment with Z-Pak. -Continue symptomatic management.   Orders: -     Clindamycin HCl; Take 1 capsule (300 mg total) by mouth 3 (three) times daily for 10 days.  Dispense: 30 capsule; Refill: 0     Meds ordered this encounter  Medications   clindamycin (CLEOCIN) 300 MG capsule    Sig: Take 1 capsule (300 mg total) by mouth 3 (three) times daily for 10 days.    Dispense:  30 capsule    Refill:  0    Orders Placed This Encounter  Procedures   Upper Respiratory Culture, Routine     Follow-up: Return if symptoms worsen or fail to improve.   An After Visit Summary was printed and given to the patient.  Total time spent on today's visit was 34 minutes, including both face-to-face time and nonface-to-face time personally spent on review of chart (labs and imaging), discussing labs and goals, discussing further work-up, treatment options, referrals to specialist if needed, reviewing outside records if pertinent, answering patient's questions, and coordinating care.    Lajuana Matte, FNP [facility] [phone]

## 2023-07-30 NOTE — Assessment & Plan Note (Signed)
Residual symptoms of sneezing, coughing, swollen lymph nodes and runny nose following recent strep throat infection and treatment with Z-Pak. -Continue symptomatic management.

## 2023-08-01 LAB — UPPER RESPIRATORY CULTURE, ROUTINE

## 2023-08-02 ENCOUNTER — Telehealth: Payer: Self-pay | Admitting: Family Medicine

## 2023-08-02 NOTE — Telephone Encounter (Signed)
Left message for patient regarding her symptoms, results and the antibiotic I sent her. Please call the office if she has any questions.

## 2023-08-12 DIAGNOSIS — R0982 Postnasal drip: Secondary | ICD-10-CM | POA: Diagnosis not present

## 2023-08-12 DIAGNOSIS — R070 Pain in throat: Secondary | ICD-10-CM | POA: Diagnosis not present

## 2023-08-12 DIAGNOSIS — J309 Allergic rhinitis, unspecified: Secondary | ICD-10-CM | POA: Diagnosis not present

## 2023-08-15 ENCOUNTER — Ambulatory Visit: Payer: Medicaid Other | Admitting: Family Medicine

## 2023-08-15 ENCOUNTER — Encounter: Payer: Self-pay | Admitting: Family Medicine

## 2023-08-15 VITALS — BP 132/80 | HR 140 | Temp 97.4°F | Wt 170.4 lb

## 2023-08-15 DIAGNOSIS — R509 Fever, unspecified: Secondary | ICD-10-CM | POA: Diagnosis not present

## 2023-08-15 DIAGNOSIS — R051 Acute cough: Secondary | ICD-10-CM

## 2023-08-15 DIAGNOSIS — J101 Influenza due to other identified influenza virus with other respiratory manifestations: Secondary | ICD-10-CM

## 2023-08-15 LAB — POCT INFLUENZA A/B
Influenza A, POC: POSITIVE — AB
Influenza B, POC: NEGATIVE

## 2023-08-15 LAB — POC COVID19 BINAXNOW: SARS Coronavirus 2 Ag: NEGATIVE

## 2023-08-15 MED ORDER — GUAIFENESIN-CODEINE 100-10 MG/5ML PO SOLN: 5.0000 mL | Freq: Three times a day (TID) | ORAL | 0 refills | Status: DC | PRN

## 2023-08-15 MED ORDER — ALBUTEROL SULFATE HFA 108 (90 BASE) MCG/ACT IN AERS: 2.0000 | INHALATION_SPRAY | Freq: Four times a day (QID) | RESPIRATORY_TRACT | 2 refills | Status: AC | PRN

## 2023-08-15 NOTE — Assessment & Plan Note (Signed)
 Flu A - positive Recent onset of symptoms including cough and difficulty breathing. Discussed the option of Tamiflu, but due to potential side effects and current shortage, patient opted for supportive care. -Continue supportive care with Mucinex , Tylenol, and Motrin as needed. -Increase fluid intake. -Dispose of current toothbrush after illness. -Monitor for signs of respiratory distress or pneumonia.

## 2023-08-15 NOTE — Assessment & Plan Note (Signed)
 Ordered Flu and Covid testing

## 2023-08-15 NOTE — Assessment & Plan Note (Signed)
 Persistent and causing discomfort. No wheezing noted on examination. -Prescribe cough medicine with codeine  for use as needed, primarily before bed. Cautioned about potential drowsiness and not to take concurrently with Lexapro . -Consider use of Broncholin for cough relief. -Albuterol  for wheezing and shortness of breath

## 2023-08-15 NOTE — Progress Notes (Signed)
 Acute Office Visit  Subjective:    Patient ID: Rhonda Shah, female    DOB: 04/05/1996, 28 y.o.   MRN: [id number]  No chief complaint on file.   Discussed the use of AI scribe software for clinical note transcription with the patient, who gave verbal consent to proceed.  HPI: Rhonda Shah is a 28 year old female who presents with flu-like symptoms and difficulty breathing.  She has been experiencing flu-like symptoms, including a cough and difficulty breathing, for approximately three days. Initially, she had a sore throat that improved with allergy medication but then progressed to a severe cough, which she describes as the most bothersome symptom. The cough is intense, causing body aches, and she is concerned about potential possibility of developing pneumonia.  She has a history of reactive airway issues, similar to bronchitis, which previously required a breathing treatment and an inhaler. She does not currently have an inhaler and experiences wheezing primarily during intense coughing fits.  She has been experiencing fever and body chills, for which she has been taking Tylenol and Motrin. She also reports feeling fatigued, having gone to bed early and sleeping through the night.   Past Medical History:  Diagnosis Date   Hypothyroidism due to medicaments and other exogenous substances    Major depressive disorder, single episode, moderate (HCC)    Palpitations     Past Surgical History:  Procedure Laterality Date   NO PAST SURGERIES      Family History  Problem Relation Age of Onset   Hypothyroidism Mother    Hypertension Father    Kidney Stones Father    Diabetes Father    Uterine cancer Paternal Grandmother    Lung cancer Paternal Grandfather     Social History   Socioeconomic History   Marital status: Married    Spouse name: Not on file   Number of children: Not on file   Years of education: Not on file   Highest education level: Not on file  Occupational  History   Not on file  Tobacco Use   Smoking status: Never   Smokeless tobacco: Never  Vaping Use   Vaping status: Never Used  Substance and Sexual Activity   Alcohol use: Yes    Comment: Couple times a month   Drug use: Not Currently   Sexual activity: Not Currently    Partners: Male  Other Topics Concern   Not on file  Social History Narrative   Not on file   Social Drivers of Health   Financial Resource Strain: Low Risk  (07/22/2023)   Overall Financial Resource Strain (CARDIA)    Difficulty of Paying Living Expenses: Not hard at all  Food Insecurity: No Food Insecurity (07/22/2023)   Hunger Vital Sign    Worried About Running Out of Food in the Last Year: Never true    Ran Out of Food in the Last Year: Never true  Transportation Needs: No Transportation Needs (07/22/2023)   PRAPARE - Administrator, Civil Service (Medical): No    Lack of Transportation (Non-Medical): No  Physical Activity: Sufficiently Active (07/22/2023)   Exercise Vital Sign    Days of Exercise per Week: 7 days    Minutes of Exercise per Session: 30 min  Stress: No Stress Concern Present (07/22/2023)   [employer] of Occupational Health - Occupational Stress Questionnaire    Feeling of Stress : Not at all  Social Connections: Moderately Integrated (07/22/2023)   Social Connection and Isolation Panel [  NHANES]    Frequency of Communication with Friends and Family: More than three times a week    Frequency of Social Gatherings with Friends and Family: More than three times a week    Attends Religious Services: More than 4 times per year    Active Member of [employer] or Organizations: Yes    Attends Banker Meetings: 1 to 4 times per year    Marital Status: Divorced  Intimate Partner Violence: Not At Risk (07/22/2023)   Humiliation, Afraid, Rape, and Kick questionnaire    Fear of Current or Ex-Partner: No    Emotionally Abused: No    Physically Abused: No    Sexually Abused: No     Outpatient Medications Prior to Visit  Medication Sig Dispense Refill   escitalopram  (LEXAPRO ) 10 MG tablet Take 1 tablet (10 mg total) by mouth daily. 90 tablet 3   levothyroxine  (SYNTHROID ) 175 MCG tablet Take 1 tablet by mouth once daily 90 tablet 0   Vitamin D , Ergocalciferol , (DRISDOL ) 1.25 MG (50000 UNIT) CAPS capsule Take 1 capsule (50,000 Units total) by mouth every 7 (seven) days for 12 doses. 12 capsule 0   No facility-administered medications prior to visit.    Allergies  Allergen Reactions   Amoxicillin-Pot Clavulanate Itching   Buspirone      palpitations    Review of Systems  Constitutional:  Positive for fever. Negative for chills, diaphoresis and fatigue.  HENT:  Positive for congestion, rhinorrhea, sneezing and trouble swallowing. Negative for ear pain and sinus pain.   Respiratory:  Positive for cough and wheezing. Negative for shortness of breath.   Cardiovascular:  Negative for chest pain.  Gastrointestinal:  Negative for abdominal pain, constipation, nausea and vomiting.  Genitourinary:  Negative for dysuria.  Musculoskeletal:  Negative for arthralgias.  Neurological:  Negative for weakness and headaches.  Psychiatric/Behavioral:  Negative for dysphoric mood. The patient is not nervous/anxious.        Objective:        08/15/2023   10:35 AM 07/30/2023    4:09 PM 07/22/2023   10:25 AM  Vitals with BMI  Height  5' 2 5' 2  Weight 170 lbs 6 oz 174 lbs 10 oz 168 lbs 6 oz  BMI 31.16 31.93 30.79  Systolic 132 110 883  Diastolic 80 76 88  Pulse 140 76 115    No data found.   Physical Exam Constitutional:      General: She is not in acute distress.    Appearance: Normal appearance. She is ill-appearing.  HENT:     Head: Normocephalic.  Eyes:     Conjunctiva/sclera: Conjunctivae normal.  Cardiovascular:     Rate and Rhythm: Normal rate and regular rhythm.     Heart sounds: Normal heart sounds. No murmur heard. Pulmonary:     Effort: Pulmonary  effort is normal.     Breath sounds: Normal breath sounds. No wheezing.  Musculoskeletal:        General: Normal range of motion.     Cervical back: Normal range of motion.  Skin:    General: Skin is warm.  Neurological:     Mental Status: She is alert. Mental status is at baseline.  Psychiatric:        Mood and Affect: Mood normal.        Behavior: Behavior normal.     Health Maintenance Due  Topic Date Due   Cervical Cancer Screening (Pap smear)  Never done   COVID-19 Vaccine (3 -  2024-25 season) 03/10/2023    There are no preventive care reminders to display for this patient.   Lab Results  Component Value Date   TSH 0.690 05/28/2023   Lab Results  Component Value Date   WBC 6.0 05/28/2023   HGB 11.1 05/28/2023   HCT 36.3 05/28/2023   MCV 82 05/28/2023   PLT 427 05/28/2023   Lab Results  Component Value Date   NA 141 05/28/2023   K 4.7 05/28/2023   CO2 22 05/28/2023   GLUCOSE 68 (L) 05/28/2023   BUN 12 05/28/2023   CREATININE 0.53 (L) 05/28/2023   BILITOT 0.2 05/28/2023   ALKPHOS 69 05/28/2023   AST 15 05/28/2023   ALT 12 05/28/2023   PROT 7.2 05/28/2023   ALBUMIN 4.6 05/28/2023   CALCIUM 9.3 05/28/2023   EGFR 128 05/28/2023   No results found for: CHOL No results found for: HDL No results found for: LDLCALC No results found for: TRIG No results found for: CHOLHDL No results found for: YHAJ8R     Assessment & Plan:  Influenza A Assessment & Plan: Flu A - positive Recent onset of symptoms including cough and difficulty breathing. Discussed the option of Tamiflu, but due to potential side effects and current shortage, patient opted for supportive care. -Continue supportive care with Mucinex , Tylenol, and Motrin as needed. -Increase fluid intake. -Dispose of current toothbrush after illness. -Monitor for signs of respiratory distress or pneumonia.   Acute cough Assessment & Plan: Persistent and causing discomfort. No wheezing noted  on examination. -Prescribe cough medicine with codeine  for use as needed, primarily before bed. Cautioned about potential drowsiness and not to take concurrently with Lexapro . -Consider use of Broncholin for cough relief. -Albuterol  for wheezing and shortness of breath  Orders: -     POC COVID-19 BinaxNow -     POCT Influenza A/B -     guaiFENesin -Codeine ; Take 5 mLs by mouth 3 (three) times daily as needed for cough.  Dispense: 120 mL; Refill: 0 -     Albuterol  Sulfate HFA; Inhale 2 puffs into the lungs every 6 (six) hours as needed for wheezing or shortness of breath.  Dispense: 8 g; Refill: 2  Fever, unspecified fever cause Assessment & Plan: Ordered Flu and Covid testing  Orders: -     POC COVID-19 BinaxNow -     POCT Influenza A/B     Meds ordered this encounter  Medications   guaiFENesin -codeine  100-10 MG/5ML syrup    Sig: Take 5 mLs by mouth 3 (three) times daily as needed for cough.    Dispense:  120 mL    Refill:  0   albuterol  (VENTOLIN  HFA) 108 (90 Base) MCG/ACT inhaler    Sig: Inhale 2 puffs into the lungs every 6 (six) hours as needed for wheezing or shortness of breath.    Dispense:  8 g    Refill:  2    Orders Placed This Encounter  Procedures   POC COVID-19   POCT Influenza A/B     Follow-up: Return if symptoms worsen or fail to improve.  An After Visit Summary was printed and given to the patient.  Total time spent on today's visit was 35 minutes, including both face-to-face time and nonface-to-face time personally spent on review of chart (labs and imaging), discussing labs and goals, discussing further work-up, treatment options, referrals to specialist if needed, reviewing outside records if pertinent, answering patient's questions, and coordinating care.    Harrie Cedar, FNP [facility] [phone]

## 2023-08-19 ENCOUNTER — Ambulatory Visit: Payer: Medicaid Other | Admitting: Family Medicine

## 2023-08-19 ENCOUNTER — Encounter: Payer: Self-pay | Admitting: Family Medicine

## 2023-08-19 ENCOUNTER — Ambulatory Visit (INDEPENDENT_AMBULATORY_CARE_PROVIDER_SITE_OTHER)
Admission: RE | Admit: 2023-08-19 | Discharge: 2023-08-19 | Disposition: A | Discharge status: Home / Self Care | Payer: Medicaid Other | Source: Ambulatory Visit | Attending: Family Medicine | Admitting: Family Medicine

## 2023-08-19 VITALS — BP 118/82 | HR 91 | Temp 97.6°F | Resp 16 | Ht 62.0 in | Wt 171.8 lb

## 2023-08-19 DIAGNOSIS — R0602 Shortness of breath: Secondary | ICD-10-CM

## 2023-08-19 DIAGNOSIS — R062 Wheezing: Secondary | ICD-10-CM | POA: Diagnosis not present

## 2023-08-19 DIAGNOSIS — J111 Influenza due to unidentified influenza virus with other respiratory manifestations: Secondary | ICD-10-CM | POA: Diagnosis not present

## 2023-08-19 DIAGNOSIS — J101 Influenza due to other identified influenza virus with other respiratory manifestations: Secondary | ICD-10-CM

## 2023-08-19 NOTE — Assessment & Plan Note (Signed)
 Abnormal lung sounds on examination and patient reports of wheezing and a "vibrating" sensation in chest. -Order stat chest x-ray at [facility] to rule out pneumonia. -If pneumonia is confirmed, initiate dual antibiotic therapy. -Ensure patient is fever-free and has had at least one day of antibiotic treatment before returning to work.

## 2023-08-19 NOTE — Progress Notes (Signed)
 Acute Office Visit  Subjective:    Patient ID: Rhonda Shah, female    DOB: [birth year], 28 y.o.   MRN: [id number]  Chief Complaint  Patient presents with   Cough   Fever   Influenza    Discussed the use of AI scribe software for clinical note transcription with the patient, who gave verbal consent to proceed.   HPI: Rhonda Shah is a 28 year old female who presents with fever and respiratory symptoms. She states she tested positive for Flu A last Thursday.   She has been experiencing fever and respiratory symptoms, including wheezing and a sensation of vibration in the chest. She is concerned about returning to work due to a coworker's flu developing into pneumonia. No significant shortness of breath, but her breathing feels 'wheezy.'  She has been using her inhaler every six hours as prescribed, which helps manage her cough. She also takes Mucinex  or cough medicine and is trying to stay hydrated.  Her symptoms, particularly the cough, worsen at night when lying down. She experiences sweating and has felt a little nauseous. No sinus pain, pressure, sore throat, or urinary symptoms. Headaches are not significant and likely due to coughing.  Past Medical History:  Diagnosis Date   Hypothyroidism due to medicaments and other exogenous substances    Major depressive disorder, single episode, moderate (HCC)    Palpitations     Past Surgical History:  Procedure Laterality Date   NO PAST SURGERIES      Family History  Problem Relation Age of Onset   Hypothyroidism Mother    Hypertension Father    Kidney Stones Father    Diabetes Father    Uterine cancer Paternal Grandmother    Lung cancer Paternal Grandfather     Social History   Socioeconomic History   Marital status: Married    Spouse name: Not on file   Number of children: Not on file   Years of education: Not on file   Highest education level: Not on file  Occupational History   Not on file  Tobacco Use    Smoking status: Never   Smokeless tobacco: Never  Vaping Use   Vaping status: Never Used  Substance and Sexual Activity   Alcohol use: Yes    Comment: Couple times a month   Drug use: Not Currently   Sexual activity: Not Currently    Partners: Male  Other Topics Concern   Not on file  Social History Narrative   Not on file   Social Drivers of Health   Financial Resource Strain: Low Risk  (07/22/2023)   Overall Financial Resource Strain (CARDIA)    Difficulty of Paying Living Expenses: Not hard at all  Food Insecurity: No Food Insecurity (07/22/2023)   Hunger Vital Sign    Worried About Running Out of Food in the Last Year: Never true    Ran Out of Food in the Last Year: Never true  Transportation Needs: No Transportation Needs (07/22/2023)   PRAPARE - Administrator, Civil Service (Medical): No    Lack of Transportation (Non-Medical): No  Physical Activity: Sufficiently Active (07/22/2023)   Exercise Vital Sign    Days of Exercise per Week: 7 days    Minutes of Exercise per Session: 30 min  Stress: No Stress Concern Present (07/22/2023)   [employer] of Occupational Health - Occupational Stress Questionnaire    Feeling of Stress : Not at all  Social Connections: Moderately Integrated (07/22/2023)  Social Advertising account executive [NHANES]    Frequency of Communication with Friends and Family: More than three times a week    Frequency of Social Gatherings with Friends and Family: More than three times a week    Attends Religious Services: More than 4 times per year    Active Member of [employer] or Organizations: Yes    Attends Banker Meetings: 1 to 4 times per year    Marital Status: Divorced  Intimate Partner Violence: Not At Risk (07/22/2023)   Humiliation, Afraid, Rape, and Kick questionnaire    Fear of Current or Ex-Partner: No    Emotionally Abused: No    Physically Abused: No    Sexually Abused: No    Outpatient Medications Prior to  Visit  Medication Sig Dispense Refill   albuterol  (VENTOLIN  HFA) 108 (90 Base) MCG/ACT inhaler Inhale 2 puffs into the lungs every 6 (six) hours as needed for wheezing or shortness of breath. 8 g 2   escitalopram  (LEXAPRO ) 10 MG tablet Take 1 tablet (10 mg total) by mouth daily. 90 tablet 3   guaiFENesin -codeine  100-10 MG/5ML syrup Take 5 mLs by mouth 3 (three) times daily as needed for cough. 120 mL 0   levothyroxine  (SYNTHROID ) 175 MCG tablet Take 1 tablet by mouth once daily 90 tablet 0   Vitamin D , Ergocalciferol , (DRISDOL ) 1.25 MG (50000 UNIT) CAPS capsule Take 50,000 Units by mouth once a week.     No facility-administered medications prior to visit.    Allergies  Allergen Reactions   Amoxicillin-Pot Clavulanate Itching   Buspirone      palpitations    Review of Systems  Constitutional:  Positive for diaphoresis and fever.  HENT:  Positive for congestion and postnasal drip. Negative for sinus pressure, sinus pain and sore throat.   Respiratory:  Positive for cough, chest tightness, shortness of breath and wheezing.   Gastrointestinal:  Positive for nausea.  Endocrine: Negative.   Genitourinary:  Negative for frequency and urgency.  Musculoskeletal: Negative.   Skin: Negative.   Allergic/Immunologic: Negative.   Neurological:  Negative for headaches.  Hematological: Negative.        Objective:        08/19/2023   11:23 AM 08/15/2023   10:35 AM 07/30/2023    4:09 PM  Vitals with BMI  Height 5\' 2"   5\' 2"   Weight 171 lbs 13 oz 170 lbs 6 oz 174 lbs 10 oz  BMI 31.41 31.16 31.93  Systolic 118 132 161  Diastolic 82 80 76  Pulse 91 140 76    Orthostatic VS for the past 72 hrs (Last 3 readings):  Patient Position BP Location Cuff Size  08/19/23 1123 Sitting Left Arm Normal     Physical Exam Vitals reviewed.  Constitutional:      General: She is not in acute distress.    Appearance: Normal appearance.  Eyes:     Conjunctiva/sclera: Conjunctivae normal.   Cardiovascular:     Rate and Rhythm: Regular rhythm. Tachycardia present.     Heart sounds: Normal heart sounds. No murmur heard. Pulmonary:     Effort: Pulmonary effort is normal.     Breath sounds: Examination of the right-upper field reveals wheezing and rhonchi. Examination of the left-upper field reveals wheezing and rhonchi. Examination of the right-lower field reveals wheezing and rhonchi. Examination of the left-lower field reveals wheezing and rhonchi. Wheezing and rhonchi present.  Abdominal:     General: Bowel sounds are normal.  Palpations: Abdomen is soft.     Tenderness: There is no abdominal tenderness.  Musculoskeletal:        General: Normal range of motion.  Lymphadenopathy:     Cervical: No cervical adenopathy.  Skin:    General: Skin is warm.  Neurological:     Mental Status: She is alert. Mental status is at baseline.  Psychiatric:        Mood and Affect: Mood normal.        Behavior: Behavior normal.     Health Maintenance Due  Topic Date Due   Cervical Cancer Screening (Pap smear)  Never done   COVID-19 Vaccine (3 - 2024-25 season) 03/10/2023    There are no preventive care reminders to display for this patient.   Lab Results  Component Value Date   TSH 0.690 05/28/2023   Lab Results  Component Value Date   WBC 6.0 05/28/2023   HGB 11.1 05/28/2023   HCT 36.3 05/28/2023   MCV 82 05/28/2023   PLT 427 05/28/2023   Lab Results  Component Value Date   NA 141 05/28/2023   K 4.7 05/28/2023   CO2 22 05/28/2023   GLUCOSE 68 (L) 05/28/2023   BUN 12 05/28/2023   CREATININE 0.53 (L) 05/28/2023   BILITOT 0.2 05/28/2023   ALKPHOS 69 05/28/2023   AST 15 05/28/2023   ALT 12 05/28/2023   PROT 7.2 05/28/2023   ALBUMIN 4.6 05/28/2023   CALCIUM 9.3 05/28/2023   EGFR 128 05/28/2023   No results found for: "CHOL" No results found for: "HDL" No results found for: "LDLCALC" No results found for: "TRIG" No results found for: "CHOLHDL" No results  found for: "HGBA1C"     Assessment & Plan:  Influenza A with respiratory manifestations Assessment & Plan: Recent diagnosis with persistent symptoms including fever, cough, and wheezing. No shortness of breath or other severe symptoms. -Continue current treatment with (inhaler and Mucinex ). -Remain off work until 24 hours fever-free. -work note generated   Shortness of breath Assessment & Plan: Abnormal lung sounds on examination and patient reports of wheezing and a "vibrating" sensation in chest. -Order stat chest x-ray at [facility] to rule out pneumonia. -If pneumonia is confirmed, initiate dual antibiotic therapy. -Ensure patient is fever-free and has had at least one day of antibiotic treatment before returning to work.  Orders: -     DG Chest 2 View; Future  Wheezing Assessment & Plan: Abnormal lung sounds on examination and patient reports of wheezing and a "vibrating" sensation in chest. -Order stat chest x-ray at [facility] to rule out pneumonia. -If pneumonia is confirmed, initiate dual antibiotic therapy. -Ensure patient is fever-free and has had at least one day of antibiotic treatment before returning to work.   Orders: -     DG Chest 2 View; Future     No orders of the defined types were placed in this encounter.   Orders Placed This Encounter  Procedures   DG Chest 2 View     Follow-up: Return if symptoms worsen or fail to improve.  An After Visit Summary was printed and given to the patient.  Total time spent on today's visit was 22 minutes, including both face-to-face time and nonface-to-face time personally spent on review of chart (labs and imaging), discussing labs and goals, discussing further work-up, treatment options, referrals to specialist if needed, reviewing outside records if pertinent, answering patient's questions, and coordinating care.    Delford Felling, FNP [facility] [phone]

## 2023-08-19 NOTE — Assessment & Plan Note (Signed)
 Abnormal lung sounds on examination and patient reports of wheezing and a "vibrating" sensation in chest. -Order stat chest x-ray at [facility] to rule out pneumonia. -If pneumonia is confirmed, initiate dual antibiotic therapy. -Ensure patient is fever-free and has had at least one day of antibiotic treatment before returning to work.

## 2023-08-19 NOTE — Assessment & Plan Note (Addendum)
 Recent diagnosis with persistent symptoms including fever, cough, and wheezing. No shortness of breath or other severe symptoms. -Continue current treatment with (inhaler and Mucinex ). -Remain off work until 24 hours fever-free. -work note generated

## 2023-08-22 NOTE — Progress Notes (Unsigned)
Subjective:  Patient ID: Rhonda Shah, female    DOB: Nov 27, 1995  Age: 28 y.o. MRN: [id number]  Chief Complaint  Patient presents with   Medical Management of Chronic Issues   Discussed the use of AI scribe software for clinical note transcription with the patient, who gave verbal consent to proceed.   HPI [name] is a 28 year old female with anxiety and thyroid disorder who presents for follow-up on anxiety and thyroid management.   Anxiety: Anxiety has improved significantly with Lexapro, which she states keeps her 'very sane.' She has not experienced weight gain with Lexapro, unlike some of her friends, and is currently satisfied with the medication's effects.   Hypothyroidism:  She is being monitored for thyroid function and has been taking her thyroid medication for many years. However, she occasionally forgets to take it, especially when running late in the morning, which has previously led to abnormal thyroid levels. Patient currently taking synthroid 150 mcg.   Vitamin D deficiency: She has been taking vitamin D once a week and recently purchased D3 supplements. She feels that vitamin D makes her a 'nicer, more energetic person.'  She has been experiencing a cough with some yellow and green drainage, which is now improving and becoming clearer. No fevers have been reported. She returned to work on Wednesday and Thursday but felt very tired, welcoming the upcoming break for Valentine's Day and President's Day.      07/22/2023   10:29 AM 03/07/2023    7:41 AM 12/27/2022   10:27 AM 11/20/2022   10:25 AM 01/04/2020    4:32 PM  Depression screen PHQ 2/9  Decreased Interest 0 1 2 0 0  Down, Depressed, Hopeless 0 1 1 1 1   PHQ - 2 Score 0 2 3 1 1   Altered sleeping  1 2 1 1   Tired, decreased energy  1 2 1 1   Change in appetite  1 2 0 0  Feeling bad or failure about yourself   1 0 0 0  Trouble concentrating  1 0 0 0  Moving slowly or fidgety/restless  0 0 0 0  Suicidal thoughts   0 0 0 0  PHQ-9 Score  7 9 3 3   Difficult doing work/chores  Very difficult Somewhat difficult Somewhat difficult Somewhat difficult        03/07/2023    7:41 AM  Fall Risk   Falls in the past year? 0  Number falls in past yr: 0  Injury with Fall? 0  Risk for fall due to : No Fall Risks  Follow up Falls evaluation completed    Patient Care Team: Renne Crigler, FNP as PCP - General (Family Medicine)   Review of Systems  Constitutional:  Negative for appetite change, fatigue and fever.  HENT:  Negative for congestion, ear pain, sinus pressure and sore throat.   Respiratory:  Positive for cough. Negative for chest tightness, shortness of breath and wheezing.   Cardiovascular:  Negative for chest pain and palpitations.  Gastrointestinal:  Negative for abdominal pain, constipation, diarrhea, nausea and vomiting.  Genitourinary:  Negative for dysuria and hematuria.  Musculoskeletal:  Negative for arthralgias, back pain, joint swelling and myalgias.  Skin:  Negative for rash.  Neurological:  Negative for dizziness, weakness and headaches.  Psychiatric/Behavioral:  Negative for dysphoric mood. The patient is not nervous/anxious.     Current Outpatient Medications on File Prior to Visit  Medication Sig Dispense Refill   albuterol (VENTOLIN HFA) 108 (90  Base) MCG/ACT inhaler Inhale 2 puffs into the lungs every 6 (six) hours as needed for wheezing or shortness of breath. 8 g 2   escitalopram (LEXAPRO) 10 MG tablet Take 1 tablet (10 mg total) by mouth daily. 90 tablet 3   levothyroxine (SYNTHROID) 175 MCG tablet Take 1 tablet by mouth once daily 90 tablet 0   Vitamin D, Ergocalciferol, (DRISDOL) 1.25 MG (50000 UNIT) CAPS capsule Take 50,000 Units by mouth once a week.     No current facility-administered medications on file prior to visit.   Past Medical History:  Diagnosis Date   Hypothyroidism due to medicaments and other exogenous substances    Major depressive disorder, single  episode, moderate (HCC)    Palpitations    Past Surgical History:  Procedure Laterality Date   NO PAST SURGERIES      Family History  Problem Relation Age of Onset   Hypothyroidism Mother    Hypertension Father    Kidney Stones Father    Diabetes Father    Uterine cancer Paternal Grandmother    Lung cancer Paternal Grandfather    Social History   Socioeconomic History   Marital status: Married    Spouse name: Not on file   Number of children: Not on file   Years of education: Not on file   Highest education level: Not on file  Occupational History   Not on file  Tobacco Use   Smoking status: Never   Smokeless tobacco: Never  Vaping Use   Vaping status: Never Used  Substance and Sexual Activity   Alcohol use: Yes    Comment: Couple times a month   Drug use: Not Currently   Sexual activity: Not Currently    Partners: Male  Other Topics Concern   Not on file  Social History Narrative   Not on file   Social Drivers of Health   Financial Resource Strain: Low Risk  (07/22/2023)   Overall Financial Resource Strain (CARDIA)    Difficulty of Paying Living Expenses: Not hard at all  Food Insecurity: No Food Insecurity (07/22/2023)   Hunger Vital Sign    Worried About Running Out of Food in the Last Year: Never true    Ran Out of Food in the Last Year: Never true  Transportation Needs: No Transportation Needs (07/22/2023)   PRAPARE - Administrator, Civil Service (Medical): No    Lack of Transportation (Non-Medical): No  Physical Activity: Sufficiently Active (07/22/2023)   Exercise Vital Sign    Days of Exercise per Week: 7 days    Minutes of Exercise per Session: 30 min  Stress: No Stress Concern Present (07/22/2023)   [employer] of Occupational Health - Occupational Stress Questionnaire    Feeling of Stress : Not at all  Social Connections: Moderately Integrated (07/22/2023)   Social Connection and Isolation Panel [NHANES]    Frequency of  Communication with Friends and Family: More than three times a week    Frequency of Social Gatherings with Friends and Family: More than three times a week    Attends Religious Services: More than 4 times per year    Active Member of [employer] or Organizations: Yes    Attends Banker Meetings: 1 to 4 times per year    Marital Status: Divorced    Objective:  BP (!) 136/98 (BP Location: Left Arm, Patient Position: Sitting)   Pulse 96   Temp (!) 97 F (36.1 C) (Temporal)   Ht 5'  2" (1.575 m)   Wt 169 lb (76.7 kg)   LMP 07/08/2023   SpO2 100%   BMI 30.91 kg/m      08/23/2023   10:49 AM 08/19/2023   11:23 AM 08/15/2023   10:35 AM  BP/Weight  Systolic BP 136 118 132  Diastolic BP 98 82 80  Wt. (Lbs) 169 171.8 170.4  BMI 30.91 kg/m2 31.42 kg/m2 31.17 kg/m2    Physical Exam Vitals reviewed.  Constitutional:      General: She is not in acute distress.    Appearance: Normal appearance. She is not ill-appearing.  Eyes:     Conjunctiva/sclera: Conjunctivae normal.  Neck:     Vascular: No carotid bruit.  Cardiovascular:     Rate and Rhythm: Normal rate and regular rhythm.     Heart sounds: Normal heart sounds. No murmur heard. Pulmonary:     Effort: Pulmonary effort is normal.     Breath sounds: Normal breath sounds. No wheezing.  Musculoskeletal:        General: Normal range of motion.  Skin:    General: Skin is warm.  Neurological:     Mental Status: She is alert. Mental status is at baseline.  Psychiatric:        Mood and Affect: Mood normal.        Behavior: Behavior normal.      Lab Results  Component Value Date   WBC 6.0 05/28/2023   HGB 11.1 05/28/2023   HCT 36.3 05/28/2023   PLT 427 05/28/2023   GLUCOSE 68 (L) 05/28/2023   ALT 12 05/28/2023   AST 15 05/28/2023   NA 141 05/28/2023   K 4.7 05/28/2023   CL 106 05/28/2023   CREATININE 0.53 (L) 05/28/2023   BUN 12 05/28/2023   CO2 22 05/28/2023   TSH 0.690 05/28/2023      Assessment & Plan:     Primary hypothyroidism Assessment & Plan: Patient reports inconsistent use of thyroid medication. -Check thyroid levels today. -continue synthroid 150 mcg once daily -Encourage consistent use of thyroid medication.  Orders: -     CBC with Differential/Platelet -     Comprehensive metabolic panel -     Lipid panel -     TSH -     T4, free  GAD (generalized anxiety disorder) Assessment & Plan: Improved with Lexapro. -Continue Lexapro as currently prescribed.   Vitamin D deficiency Assessment & Plan: Patient reports consistent use of weekly Vitamin D supplement. -Check Vitamin D levels today.   Orders: -     VITAMIN D 25 Hydroxy (Vit-D Deficiency, Fractures)     No orders of the defined types were placed in this encounter.   Orders Placed This Encounter  Procedures   CBC with Differential   Comprehensive metabolic panel   Lipid Panel   TSH   T4, Free   Vitamin D, 25-hydroxy     Follow-up: Return in about 3 months (around 11/20/2023) for chronic.  An After Visit Summary was printed and given to the patient.  Total time spent on today's visit was 35 minutes, including both face-to-face time and nonface-to-face time personally spent on review of chart (labs and imaging), discussing labs and goals, discussing further work-up, treatment options, referrals to specialist if needed, reviewing outside records if pertinent, answering patient's questions, and coordinating care.    Lajuana Matte, FNP [facility] [phone]

## 2023-08-23 ENCOUNTER — Ambulatory Visit: Payer: Medicaid Other | Admitting: Family Medicine

## 2023-08-23 ENCOUNTER — Encounter: Payer: Self-pay | Admitting: Family Medicine

## 2023-08-23 VITALS — BP 136/98 | HR 96 | Temp 97.0°F | Ht 62.0 in | Wt 169.0 lb

## 2023-08-23 DIAGNOSIS — F411 Generalized anxiety disorder: Secondary | ICD-10-CM | POA: Diagnosis not present

## 2023-08-23 DIAGNOSIS — E559 Vitamin D deficiency, unspecified: Secondary | ICD-10-CM | POA: Diagnosis not present

## 2023-08-23 DIAGNOSIS — E039 Hypothyroidism, unspecified: Secondary | ICD-10-CM

## 2023-08-23 NOTE — Assessment & Plan Note (Signed)
Patient reports inconsistent use of thyroid medication. -Check thyroid levels today. -continue synthroid 150 mcg once daily -Encourage consistent use of thyroid medication.

## 2023-08-23 NOTE — Assessment & Plan Note (Signed)
Patient reports consistent use of weekly Vitamin D supplement. -Check Vitamin D levels today.

## 2023-08-23 NOTE — Assessment & Plan Note (Signed)
Improved with Lexapro. -Continue Lexapro as currently prescribed.

## 2023-08-24 LAB — CBC WITH DIFFERENTIAL/PLATELET
Basophils Absolute: 0.1 10*3/uL (ref 0.0–0.2)
Basos: 1 %
EOS (ABSOLUTE): 0 10*3/uL (ref 0.0–0.4)
Eos: 1 %
Hematocrit: 38.9 % (ref 34.0–46.6)
Hemoglobin: 11.6 g/dL (ref 11.1–15.9)
Immature Grans (Abs): 0 10*3/uL (ref 0.0–0.1)
Immature Granulocytes: 1 %
Lymphocytes Absolute: 1.8 10*3/uL (ref 0.7–3.1)
Lymphs: 28 %
MCH: 24.3 pg — ABNORMAL LOW (ref 26.6–33.0)
MCHC: 29.8 g/dL — ABNORMAL LOW (ref 31.5–35.7)
MCV: 82 fL (ref 79–97)
Monocytes Absolute: 0.7 10*3/uL (ref 0.1–0.9)
Monocytes: 11 %
Neutrophils Absolute: 3.7 10*3/uL (ref 1.4–7.0)
Neutrophils: 58 %
Platelets: 429 10*3/uL (ref 150–450)
RBC: 4.77 x10E6/uL (ref 3.77–5.28)
RDW: 15.7 % — ABNORMAL HIGH (ref 11.7–15.4)
WBC: 6.2 10*3/uL (ref 3.4–10.8)

## 2023-08-24 LAB — COMPREHENSIVE METABOLIC PANEL
ALT: 19 [IU]/L (ref 0–32)
AST: 20 [IU]/L (ref 0–40)
Albumin: 4.5 g/dL (ref 4.0–5.0)
Alkaline Phosphatase: 65 [IU]/L (ref 44–121)
BUN/Creatinine Ratio: 16 (ref 9–23)
BUN: 9 mg/dL (ref 6–20)
Bilirubin Total: 0.3 mg/dL (ref 0.0–1.2)
CO2: 22 mmol/L (ref 20–29)
Calcium: 9.1 mg/dL (ref 8.7–10.2)
Chloride: 104 mmol/L (ref 96–106)
Creatinine, Ser: 0.55 mg/dL — ABNORMAL LOW (ref 0.57–1.00)
Globulin, Total: 2.9 g/dL (ref 1.5–4.5)
Glucose: 79 mg/dL (ref 70–99)
Potassium: 4.4 mmol/L (ref 3.5–5.2)
Sodium: 140 mmol/L (ref 134–144)
Total Protein: 7.4 g/dL (ref 6.0–8.5)
eGFR: 127 mL/min/{1.73_m2} (ref >59)

## 2023-08-24 LAB — VITAMIN D 25 HYDROXY (VIT D DEFICIENCY, FRACTURES): Vit D, 25-Hydroxy: 38.1 ng/mL (ref 30.0–100.0)

## 2023-08-24 LAB — LIPID PANEL
Chol/HDL Ratio: 3.7 {ratio} (ref 0.0–4.4)
Cholesterol, Total: 160 mg/dL (ref 100–199)
HDL: 43 mg/dL (ref >39)
LDL Chol Calc (NIH): 95 mg/dL (ref 0–99)
Triglycerides: 122 mg/dL (ref 0–149)
VLDL Cholesterol Cal: 22 mg/dL (ref 5–40)

## 2023-08-24 LAB — TSH: TSH: 0.424 u[IU]/mL — ABNORMAL LOW (ref 0.450–4.500)

## 2023-08-24 LAB — T4, FREE: Free T4: 1.62 ng/dL (ref 0.82–1.77)

## 2023-09-03 ENCOUNTER — Ambulatory Visit: Payer: Medicaid Other | Admitting: Family Medicine

## 2023-11-14 ENCOUNTER — Other Ambulatory Visit: Payer: Self-pay | Admitting: Family Medicine

## 2023-11-14 ENCOUNTER — Encounter (HOSPITAL_COMMUNITY): Payer: Self-pay

## 2023-11-14 DIAGNOSIS — E039 Hypothyroidism, unspecified: Secondary | ICD-10-CM

## 2023-11-21 ENCOUNTER — Ambulatory Visit: Payer: Medicaid Other | Admitting: Family Medicine

## 2023-12-02 ENCOUNTER — Ambulatory Visit (HOSPITAL_BASED_OUTPATIENT_CLINIC_OR_DEPARTMENT_OTHER)
Admission: RE | Admit: 2023-12-02 | Discharge: 2023-12-02 | Disposition: A | Discharge status: Home / Self Care | Source: Ambulatory Visit | Attending: Family Medicine | Admitting: Family Medicine

## 2023-12-02 ENCOUNTER — Encounter (HOSPITAL_BASED_OUTPATIENT_CLINIC_OR_DEPARTMENT_OTHER): Payer: Self-pay

## 2023-12-02 VITALS — BP 131/91 | HR 100 | Temp 98.4°F | Resp 18

## 2023-12-02 DIAGNOSIS — T7421XA Adult sexual abuse, confirmed, initial encounter: Secondary | ICD-10-CM

## 2023-12-02 DIAGNOSIS — R309 Painful micturition, unspecified: Secondary | ICD-10-CM | POA: Insufficient documentation

## 2023-12-02 DIAGNOSIS — B3731 Acute candidiasis of vulva and vagina: Secondary | ICD-10-CM

## 2023-12-02 DIAGNOSIS — R350 Frequency of micturition: Secondary | ICD-10-CM | POA: Diagnosis not present

## 2023-12-02 DIAGNOSIS — N390 Urinary tract infection, site not specified: Secondary | ICD-10-CM | POA: Diagnosis not present

## 2023-12-02 DIAGNOSIS — Z7721 Contact with and (suspected) exposure to potentially hazardous body fluids: Secondary | ICD-10-CM

## 2023-12-02 DIAGNOSIS — R300 Dysuria: Secondary | ICD-10-CM | POA: Diagnosis not present

## 2023-12-02 DIAGNOSIS — N898 Other specified noninflammatory disorders of vagina: Secondary | ICD-10-CM | POA: Diagnosis not present

## 2023-12-02 LAB — POCT URINALYSIS DIP (MANUAL ENTRY)
Bilirubin, UA: NEGATIVE
Glucose, UA: NEGATIVE mg/dL
Ketones, POC UA: NEGATIVE mg/dL
Nitrite, UA: NEGATIVE
Protein Ur, POC: 30 mg/dL — AB
Spec Grav, UA: 1.025
Urobilinogen, UA: 0.2 U/dL
pH, UA: 7

## 2023-12-02 MED ORDER — NITROFURANTOIN MONOHYD MACRO 100 MG PO CAPS: 100.0000 mg | ORAL_CAPSULE | Freq: Two times a day (BID) | ORAL | 0 refills | Status: DC

## 2023-12-02 MED ORDER — FLUCONAZOLE 150 MG PO TABS: ORAL_TABLET | ORAL | 0 refills | Status: AC

## 2023-12-02 MED ORDER — AZITHROMYCIN 250 MG PO TABS: ORAL_TABLET | ORAL | 0 refills | Status: DC

## 2023-12-02 MED ORDER — EMTRICITABINE-TENOFOVIR DF 200-300 MG PO TABS: 1.0000 | ORAL_TABLET | Freq: Every day | ORAL | 0 refills | Status: AC

## 2023-12-02 MED ORDER — CEFTRIAXONE SODIUM 500 MG IJ SOLR
500.0000 mg | INTRAMUSCULAR | Status: DC
  Administered 2023-12-02: 500 mg via INTRAMUSCULAR

## 2023-12-02 MED ORDER — RALTEGRAVIR POTASSIUM 400 MG PO TABS: 400.0000 mg | ORAL_TABLET | Freq: Two times a day (BID) | ORAL | 0 refills | Status: AC
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
## 2023-12-03 ENCOUNTER — Ambulatory Visit (HOSPITAL_BASED_OUTPATIENT_CLINIC_OR_DEPARTMENT_OTHER): Payer: Self-pay | Admitting: Family Medicine

## 2023-12-03 LAB — CERVICOVAGINAL ANCILLARY ONLY
Bacterial Vaginitis (gardnerella): NEGATIVE
Candida Glabrata: NEGATIVE
Candida Vaginitis: NEGATIVE
Chlamydia: NEGATIVE
Comment: NEGATIVE
Comment: NEGATIVE
Comment: NEGATIVE
Comment: NEGATIVE
Comment: NEGATIVE
Comment: NORMAL
Neisseria Gonorrhea: NEGATIVE
Trichomonas: NEGATIVE

## 2023-12-03 NOTE — Progress Notes (Signed)
 STI swab testing was negative.  Patient updated via VM message.

## 2023-12-04 ENCOUNTER — Telehealth (HOSPITAL_BASED_OUTPATIENT_CLINIC_OR_DEPARTMENT_OTHER): Payer: Self-pay

## 2023-12-04 ENCOUNTER — Telehealth (HOSPITAL_BASED_OUTPATIENT_CLINIC_OR_DEPARTMENT_OTHER): Payer: Self-pay | Admitting: Family Medicine

## 2023-12-04 LAB — URINE CULTURE: Culture: >=100000 — AB

## 2023-12-04 LAB — HIV ANTIBODY (ROUTINE TESTING W REFLEX): HIV Screen 4th Generation wRfx: NONREACTIVE

## 2023-12-04 LAB — RPR: RPR Ser Ql: NONREACTIVE

## 2023-12-04 MED ORDER — SULFAMETHOXAZOLE-TRIMETHOPRIM 800-160 MG PO TABS: 1.0000 | ORAL_TABLET | Freq: Two times a day (BID) | ORAL | 0 refills | Status: AC

## 2023-12-04 NOTE — Telephone Encounter (Signed)
 Sending in Bactrim  to treat UTI due to resistance to Macrobid  per culture

## 2023-12-04 NOTE — Telephone Encounter (Signed)
 Will send in Bactrim to treat UTI due to resistance to Macrobid

## 2024-02-14 ENCOUNTER — Ambulatory Visit (HOSPITAL_BASED_OUTPATIENT_CLINIC_OR_DEPARTMENT_OTHER)
Admission: RE | Admit: 2024-02-14 | Discharge: 2024-02-14 | Disposition: A | Discharge status: Home / Self Care | Source: Ambulatory Visit | Attending: Family Medicine | Admitting: Family Medicine

## 2024-02-14 ENCOUNTER — Encounter (HOSPITAL_BASED_OUTPATIENT_CLINIC_OR_DEPARTMENT_OTHER): Payer: Self-pay

## 2024-02-14 VITALS — BP 124/89 | HR 102 | Temp 98.5°F | Resp 20

## 2024-02-14 DIAGNOSIS — J029 Acute pharyngitis, unspecified: Secondary | ICD-10-CM | POA: Insufficient documentation

## 2024-02-14 DIAGNOSIS — R051 Acute cough: Secondary | ICD-10-CM | POA: Insufficient documentation

## 2024-02-14 DIAGNOSIS — R509 Fever, unspecified: Secondary | ICD-10-CM | POA: Insufficient documentation

## 2024-02-14 LAB — POC SOFIA SARS ANTIGEN FIA: SARS Coronavirus 2 Ag: NEGATIVE

## 2024-02-14 LAB — POCT RAPID STREP A (OFFICE): Rapid Strep A Screen: NEGATIVE

## 2024-02-14 NOTE — ED Provider Notes (Signed)
 [facility]    CSN: [id number] Arrival date & time: 02/14/24  1157      History   Chief Complaint Chief Complaint  Patient presents with   Sore Throat    HPI Rhonda Shah is a 28 y.o. female.   28 year old female here with her mother.  She has had a sore throat since 02/11/2024.  She did notice some white spots on her tonsils and thought she might have strep throat.  She has felt like she had a fever but has not been able to monitor her actual temperature.  She has had some cough, soreness in her neck like her glands are swollen and nausea but no vomiting.  She did not take anything for her symptoms.   Sore Throat Pertinent negatives include no chest pain, no abdominal pain and no shortness of breath.    Past Medical History:  Diagnosis Date   Hypothyroidism due to medicaments and other exogenous substances    Major depressive disorder, single episode, moderate (HCC)    Palpitations     Patient Active Problem List   Diagnosis Date Noted   Vitamin D  deficiency 08/23/2023   Shortness of breath 08/19/2023   Wheezing 08/19/2023   Influenza A with respiratory manifestations 08/15/2023   Lymphadenopathy, submental 07/30/2023   Strep pharyngitis 07/22/2023   Sore throat 07/22/2023   Other fatigue 05/28/2023   Acute cough 05/28/2023   Encounter for hepatitis C screening test for low risk patient 05/28/2023   Generalized abdominal pain 05/28/2023   Diarrhea 05/28/2023   Need for vaccination 05/28/2023   Elevated blood pressure reading without diagnosis of hypertension 05/28/2023   GAD (generalized anxiety disorder) 03/07/2023   Acute cystitis with hematuria 12/27/2022   Benign paroxysmal positional vertigo of right ear 11/20/2022   Fever 11/20/2022   Major depressive disorder, single episode, moderate (HCC) 02/02/2020   Primary hypothyroidism 01/04/2020    Past Surgical History:  Procedure Laterality Date   NO PAST SURGERIES      OB History   No obstetric  history on file.      Home Medications    Prior to Admission medications   Medication Sig Start Date End Date Taking? Authorizing Provider  albuterol  (VENTOLIN  HFA) 108 (90 Base) MCG/ACT inhaler Inhale 2 puffs into the lungs every 6 (six) hours as needed for wheezing or shortness of breath. 08/15/23   Teressa Harrie HERO, FNP  escitalopram  (LEXAPRO ) 10 MG tablet Take 1 tablet (10 mg total) by mouth daily. 03/07/23   Teressa Harrie HERO, FNP  fluconazole  (DIFLUCAN ) 150 MG tablet By mouth today and repeat in 5-7 days for vaginal yeast infection. 12/02/23   Ival Domino, FNP  levothyroxine  (SYNTHROID ) 175 MCG tablet Take 1 tablet by mouth once daily 11/14/23   Teressa Harrie HERO, FNP    Family History Family History  Problem Relation Age of Onset   Hypothyroidism Mother    Hypertension Father    Kidney Stones Father    Diabetes Father    Uterine cancer Paternal Grandmother    Lung cancer Paternal Grandfather     Social History Social History   Tobacco Use   Smoking status: Never   Smokeless tobacco: Never  Vaping Use   Vaping status: Never Used  Substance Use Topics   Alcohol use: Yes    Comment: Couple times a month   Drug use: Not Currently     Allergies   Amoxicillin-pot clavulanate, Bactrim  [sulfamethoxazole -trimethoprim ], and Buspirone    Review of Systems Review of  Systems  Constitutional:  Positive for fever. Negative for chills.  HENT:  Positive for sore throat. Negative for ear pain.   Eyes:  Negative for pain and visual disturbance.  Respiratory:  Positive for cough. Negative for shortness of breath.   Cardiovascular:  Negative for chest pain and palpitations.  Gastrointestinal:  Negative for abdominal pain, constipation, diarrhea, nausea and vomiting.  Genitourinary:  Negative for dysuria and hematuria.  Musculoskeletal:  Negative for arthralgias and back pain.  Skin:  Negative for color change and rash.  Neurological:  Negative for seizures and syncope.  All other  systems reviewed and are negative.    Physical Exam Triage Vital Signs ED Triage Vitals  Encounter Vitals Group     BP 02/14/24 1220 124/89     Girls Systolic BP Percentile --      Girls Diastolic BP Percentile --      Boys Systolic BP Percentile --      Boys Diastolic BP Percentile --      Pulse Rate 02/14/24 1220 (!) 102     Resp 02/14/24 1220 20     Temp 02/14/24 1220 98.5 F (36.9 C)     Temp Source 02/14/24 1220 Oral     SpO2 02/14/24 1220 98 %     Weight --      Height --      Head Circumference --      Peak Flow --      Pain Score 02/14/24 1216 6     Pain Loc --      Pain Education --      Exclude from Growth Chart --    No data found.  Updated Vital Signs BP 124/89 (BP Location: Right Arm)   Pulse (!) 102   Temp 98.5 F (36.9 C) (Oral)   Resp 20   LMP 02/07/2024 (Exact Date)   SpO2 98%   Visual Acuity Right Eye Distance:   Left Eye Distance:   Bilateral Distance:    Right Eye Near:   Left Eye Near:    Bilateral Near:     Physical Exam Vitals and nursing note reviewed.  Constitutional:      General: She is not in acute distress.    Appearance: She is well-developed. She is not ill-appearing, toxic-appearing or diaphoretic.  HENT:     Head: Normocephalic and atraumatic.     Right Ear: Hearing, tympanic membrane, ear canal and external ear normal.     Left Ear: Hearing, tympanic membrane, ear canal and external ear normal.     Nose: No congestion or rhinorrhea.     Right Sinus: No maxillary sinus tenderness or frontal sinus tenderness.     Left Sinus: No maxillary sinus tenderness or frontal sinus tenderness.     Mouth/Throat:     Lips: Pink.     Mouth: Mucous membranes are moist.     Pharynx: Uvula midline. Posterior oropharyngeal erythema present. No oropharyngeal exudate.     Tonsils: No tonsillar exudate (Some enlargement and erythema of both tonsils but no exudate).  Eyes:     Conjunctiva/sclera: Conjunctivae normal.     Pupils: Pupils are  equal, round, and reactive to light.  Cardiovascular:     Rate and Rhythm: Normal rate and regular rhythm.     Heart sounds: S1 normal and S2 normal. No murmur heard. Pulmonary:     Effort: Pulmonary effort is normal. No respiratory distress.     Breath sounds: Normal breath sounds. No decreased breath  sounds, wheezing, rhonchi or rales.  Abdominal:     General: Bowel sounds are normal.     Palpations: Abdomen is soft.     Tenderness: There is no abdominal tenderness.  Musculoskeletal:        General: No swelling.     Cervical back: Neck supple.  Lymphadenopathy:     Head:     Right side of head: No submental, submandibular, tonsillar, preauricular or posterior auricular adenopathy.     Left side of head: No submental, submandibular, tonsillar, preauricular or posterior auricular adenopathy.     Cervical: Cervical adenopathy present.     Right cervical: Superficial cervical adenopathy present.     Left cervical: Superficial cervical adenopathy present.  Skin:    General: Skin is warm and dry.     Capillary Refill: Capillary refill takes less than 2 seconds.     Findings: No rash.  Neurological:     Mental Status: She is alert and oriented to person, place, and time.  Psychiatric:        Mood and Affect: Mood normal.      UC Treatments / Results  Labs (all labs ordered are listed, but only abnormal results are displayed) Labs Reviewed  POCT RAPID STREP A (OFFICE) - Normal  POC SOFIA SARS ANTIGEN FIA - Normal  CULTURE, GROUP A STREP [facility])    EKG   Radiology No results found.  Procedures Procedures (including critical care time)  Medications Ordered in UC Medications - No data to display  Initial Impression / Assessment and Plan / UC Course  I have reviewed the triage vital signs and the nursing notes.  Pertinent labs & imaging results that were available during my care of the patient were reviewed by me and considered in my medical decision making (see chart  for details).  Plan of Care: Sore throat with probable fever and cough: Rapid strep and rapid COVID were negative.  Throat culture sent.  Get plenty of fluids and rest.  Will adjust the plan of care, if needed once the culture results.  This is most likely a viral illness and will resolve with comfort measures or without treatment.  Work excuse provided.  Follow-up if symptoms do not improve, worsen or new symptoms occur.  I reviewed the plan of care with the patient and/or the patient's guardian.  The patient and/or guardian had time to ask questions and acknowledged that the questions were answered.  I provided instruction on symptoms or reasons to return here or to go to an ER, if symptoms/condition did not improve, worsened or if new symptoms occurred.  Final Clinical Impressions(s) / UC Diagnoses   Final diagnoses:  Acute cough  Fever, unspecified  Sore throat     Discharge Instructions      Sore throat with probable fever and cough: Rapid strep was negative.  Throat culture sent.  Rapid COVID was negative.  Will adjust the plan of care, if needed once the throat culture results.  Get plenty of fluids and rest.  Follow-up if symptoms do not improve, worsen or new symptoms occur     ED Prescriptions   None    PDMP not reviewed this encounter.   Ival Domino, FNP 02/14/24 1302

## 2024-02-14 NOTE — ED Triage Notes (Signed)
 Pt c/o sore throat that started 3 days ago. She has noticed white spots on her tonsils and is concerned she has strep. Pt has felt like she had a fever but did not check. She is also having nausea, cough-dry, and some neck tenderness- feels swollen. Pt has not taken anything for her symptoms.

## 2024-02-14 NOTE — Discharge Instructions (Signed)
 Sore throat with probable fever and cough: Rapid strep was negative.  Throat culture sent.  Rapid COVID was negative.  Will adjust the plan of care, if needed once the throat culture results.  Get plenty of fluids and rest.  Follow-up if symptoms do not improve, worsen or new symptoms occur

## 2024-02-17 ENCOUNTER — Ambulatory Visit (HOSPITAL_COMMUNITY): Payer: Self-pay

## 2024-02-17 LAB — CULTURE, GROUP A STREP (THRC)

## 2024-02-17 NOTE — Progress Notes (Signed)
 Throat culture is negative.  Patient came in on 02/16/2024 as support for her friend who is being seen.  She reported that time that she was already feeling better and symptoms were resolving.  She was updated via telephone voicemail message that her results were negative and no additional medications nor antibiotics were needed.

## 2024-03-17 ENCOUNTER — Other Ambulatory Visit: Payer: Self-pay | Admitting: Family Medicine

## 2024-03-17 DIAGNOSIS — E039 Hypothyroidism, unspecified: Secondary | ICD-10-CM

## 2024-04-01 ENCOUNTER — Ambulatory Visit (HOSPITAL_BASED_OUTPATIENT_CLINIC_OR_DEPARTMENT_OTHER)
Admission: EM | Admit: 2024-04-01 | Discharge: 2024-04-01 | Disposition: A | Discharge status: Home / Self Care | Attending: Family Medicine | Admitting: Family Medicine

## 2024-04-01 ENCOUNTER — Ambulatory Visit (HOSPITAL_BASED_OUTPATIENT_CLINIC_OR_DEPARTMENT_OTHER): Admit: 2024-04-01 | Discharge: 2024-04-01 | Disposition: A | Discharge status: Home / Self Care | Admitting: Radiology

## 2024-04-01 ENCOUNTER — Encounter (HOSPITAL_BASED_OUTPATIENT_CLINIC_OR_DEPARTMENT_OTHER): Payer: Self-pay | Admitting: Emergency Medicine

## 2024-04-01 DIAGNOSIS — R0789 Other chest pain: Secondary | ICD-10-CM | POA: Diagnosis not present

## 2024-04-01 DIAGNOSIS — R071 Chest pain on breathing: Secondary | ICD-10-CM | POA: Diagnosis not present

## 2024-04-01 DIAGNOSIS — R059 Cough, unspecified: Secondary | ICD-10-CM | POA: Diagnosis not present

## 2024-04-01 DIAGNOSIS — R051 Acute cough: Secondary | ICD-10-CM

## 2024-04-01 MED ORDER — TRIAMCINOLONE ACETONIDE 40 MG/ML IJ SUSP
40.0000 mg | Freq: Once | INTRAMUSCULAR | Status: AC
  Administered 2024-04-01: 40 mg via INTRAMUSCULAR

## 2024-04-01 NOTE — Discharge Instructions (Addendum)
 Painful respiration and chronic cough: Chest x-ray is negative.  Kenalog  40 mg injection now.  Use acetaminophen if anything needed for pain.  Avoid NSAIDs.  Get plenty of fluids and rest.  Follow-up if symptoms do not improve, worsen or new symptoms occur.

## 2024-04-01 NOTE — ED Provider Notes (Signed)
 [facility]    CSN: [id number] Arrival date & time: 04/01/24  1712      History   Chief Complaint Chief Complaint  Patient presents with   Pleurisy    HPI Careen Mauch is a 28 y.o. female.   28 year old female with a dry cough since approximately 03/11/2024 or earlier.  She has some left chest pain when she breathes out that started on 03/29/2024.  She denies fever, wheezing, nausea, vomiting, constipation, diarrhea.     Past Medical History:  Diagnosis Date   Hypothyroidism due to medicaments and other exogenous substances    Major depressive disorder, single episode, moderate (HCC)    Palpitations     Patient Active Problem List   Diagnosis Date Noted   Vitamin D  deficiency 08/23/2023   Shortness of breath 08/19/2023   Wheezing 08/19/2023   Influenza A with respiratory manifestations 08/15/2023   Lymphadenopathy, submental 07/30/2023   Strep pharyngitis 07/22/2023   Sore throat 07/22/2023   Other fatigue 05/28/2023   Acute cough 05/28/2023   Encounter for hepatitis C screening test for low risk patient 05/28/2023   Generalized abdominal pain 05/28/2023   Diarrhea 05/28/2023   Need for vaccination 05/28/2023   Elevated blood pressure reading without diagnosis of hypertension 05/28/2023   GAD (generalized anxiety disorder) 03/07/2023   Acute cystitis with hematuria 12/27/2022   Benign paroxysmal positional vertigo of right ear 11/20/2022   Fever 11/20/2022   Major depressive disorder, single episode, moderate (HCC) 02/02/2020   Primary hypothyroidism 01/04/2020    Past Surgical History:  Procedure Laterality Date   NO PAST SURGERIES      OB History   No obstetric history on file.      Home Medications    Prior to Admission medications   Medication Sig Start Date End Date Taking? Authorizing Provider  escitalopram  (LEXAPRO ) 10 MG tablet Take 1 tablet (10 mg total) by mouth daily. 03/07/23  Yes Teressa Harrie HERO, FNP  levothyroxine  (SYNTHROID )  175 MCG tablet Take 1 tablet by mouth once daily 03/17/24  Yes Teressa Harrie HERO, FNP  albuterol  (VENTOLIN  HFA) 108 (90 Base) MCG/ACT inhaler Inhale 2 puffs into the lungs every 6 (six) hours as needed for wheezing or shortness of breath. 08/15/23   Teressa Harrie HERO, FNP  fluconazole  (DIFLUCAN ) 150 MG tablet By mouth today and repeat in 5-7 days for vaginal yeast infection. 12/02/23   Ival Domino, FNP    Family History Family History  Problem Relation Age of Onset   Hypothyroidism Mother    Hypertension Father    Kidney Stones Father    Diabetes Father    Uterine cancer Paternal Grandmother    Lung cancer Paternal Grandfather     Social History Social History   Tobacco Use   Smoking status: Never   Smokeless tobacco: Never  Vaping Use   Vaping status: Never Used  Substance Use Topics   Alcohol use: Yes    Comment: Couple times a month   Drug use: Not Currently     Allergies   Amoxicillin-pot clavulanate, Bactrim  [sulfamethoxazole -trimethoprim ], and Buspirone    Review of Systems Review of Systems  Constitutional:  Negative for chills and fever.  HENT:  Negative for ear pain and sore throat.   Eyes:  Negative for pain and visual disturbance.  Respiratory:  Positive for cough. Negative for shortness of breath.   Cardiovascular:  Positive for chest pain (With respiration.). Negative for palpitations.  Gastrointestinal:  Negative for abdominal pain, constipation, diarrhea, nausea  and vomiting.  Genitourinary:  Negative for dysuria and hematuria.  Musculoskeletal:  Negative for arthralgias and back pain.  Skin:  Negative for color change and rash.  Neurological:  Negative for seizures and syncope.  All other systems reviewed and are negative.    Physical Exam Triage Vital Signs ED Triage Vitals  Encounter Vitals Group     BP 04/01/24 1734 (!) 157/88     Girls Systolic BP Percentile --      Girls Diastolic BP Percentile --      Boys Systolic BP Percentile --      Boys  Diastolic BP Percentile --      Pulse Rate 04/01/24 1734 71     Resp 04/01/24 1734 18     Temp 04/01/24 1734 99 F (37.2 C)     Temp Source 04/01/24 1734 Oral     SpO2 04/01/24 1734 98 %     Weight --      Height --      Head Circumference --      Peak Flow --      Pain Score 04/01/24 1733 0     Pain Loc --      Pain Education --      Exclude from Growth Chart --    No data found.  Updated Vital Signs BP (!) 157/88 (BP Location: Right Arm)   Pulse 71   Temp 99 F (37.2 C) (Oral)   Resp 18   LMP 03/28/2024   SpO2 98%   Visual Acuity Right Eye Distance:   Left Eye Distance:   Bilateral Distance:    Right Eye Near:   Left Eye Near:    Bilateral Near:     Physical Exam Vitals and nursing note reviewed.  Constitutional:      General: She is not in acute distress.    Appearance: She is well-developed. She is not ill-appearing or toxic-appearing.  HENT:     Head: Normocephalic and atraumatic.     Right Ear: Hearing and external ear normal. There is impacted cerumen.     Left Ear: Hearing and external ear normal. There is impacted cerumen.     Nose: No congestion or rhinorrhea.     Right Sinus: No maxillary sinus tenderness or frontal sinus tenderness.     Left Sinus: No maxillary sinus tenderness or frontal sinus tenderness.     Mouth/Throat:     Lips: Pink.     Mouth: Mucous membranes are moist.     Pharynx: Uvula midline. No oropharyngeal exudate or posterior oropharyngeal erythema.     Tonsils: No tonsillar exudate.  Eyes:     Conjunctiva/sclera: Conjunctivae normal.     Pupils: Pupils are equal, round, and reactive to light.  Cardiovascular:     Rate and Rhythm: Normal rate and regular rhythm.     Heart sounds: S1 normal and S2 normal. No murmur heard. Pulmonary:     Effort: Pulmonary effort is normal. No respiratory distress.     Breath sounds: Normal breath sounds. No decreased breath sounds, wheezing, rhonchi or rales.  Chest:     Chest wall: Tenderness  (Left anterior chest pain/pinpoint tenderness above the breast.) present.  Abdominal:     General: Bowel sounds are normal.     Palpations: Abdomen is soft.     Tenderness: There is no abdominal tenderness.  Musculoskeletal:        General: No swelling.     Cervical back: Neck supple.  Lymphadenopathy:  Head:     Right side of head: No submental, submandibular, tonsillar, preauricular or posterior auricular adenopathy.     Left side of head: No submental, submandibular, tonsillar, preauricular or posterior auricular adenopathy.     Cervical: No cervical adenopathy.     Right cervical: No superficial cervical adenopathy.    Left cervical: No superficial cervical adenopathy.  Skin:    General: Skin is warm and dry.     Capillary Refill: Capillary refill takes less than 2 seconds.     Findings: No rash.  Neurological:     Mental Status: She is alert and oriented to person, place, and time.  Psychiatric:        Mood and Affect: Mood normal.      UC Treatments / Results  Labs (all labs ordered are listed, but only abnormal results are displayed) Labs Reviewed - No data to display  EKG   Radiology 04/01/24: EXAM:  CHEST - 2 VIEW COMPARISON:  08/19/2023 FINDINGS: Normal heart size and pulmonary vascularity. No focal airspace disease or consolidation in the lungs. No blunting of costophrenic angles. No pneumothorax. Mediastinal contours appear intact. IMPRESSION: No active cardiopulmonary disease. Electronically Signed      By: Elsie Gravely M.D.      On: 04/01/2024 18:25   Procedures Procedures (including critical care time)  Medications Ordered in UC Medications  triamcinolone  acetonide (KENALOG -40) injection 40 mg (40 mg Intramuscular Given 04/01/24 1846)    Initial Impression / Assessment and Plan / UC Course  I have reviewed the triage vital signs and the nursing notes.  Pertinent labs & imaging results that were available during my care of the patient were  reviewed by me and considered in my medical decision making (see chart for details).  Plan of Care: Painful respiration and chronic cough: Chest x-ray is negative.  Kenalog  40 mg injection now.  Use acetaminophen if anything needed for pain.  Avoid NSAIDs.  Get plenty of fluids and rest.  Follow-up if symptoms do not improve, worsen or new symptoms occur.  I reviewed the plan of care with the patient and/or the patient's guardian.  The patient and/or guardian had time to ask questions and acknowledged that the questions were answered.  I provided instruction on symptoms or reasons to return here or to go to an ER, if symptoms/condition did not improve, worsened or if new symptoms occurred.  Final Clinical Impressions(s) / UC Diagnoses   Final diagnoses:  Painful respiration  Chest wall pain  Acute cough     Discharge Instructions      Painful respiration and chronic cough: Chest x-ray is negative.  Kenalog  40 mg injection now.  Use acetaminophen if anything needed for pain.  Avoid NSAIDs.  Get plenty of fluids and rest.  Follow-up if symptoms do not improve, worsen or new symptoms occur.     ED Prescriptions   None    PDMP not reviewed this encounter.   Ival Domino, FNP 04/06/24 1016

## 2024-04-01 NOTE — ED Triage Notes (Signed)
 Pt had a cough for 2-3 weeks, pt reports chest discomfort after she takes a deep breathe.

## 2024-06-12 ENCOUNTER — Other Ambulatory Visit: Payer: Self-pay | Admitting: Family Medicine

## 2024-06-12 DIAGNOSIS — E039 Hypothyroidism, unspecified: Secondary | ICD-10-CM

## 2024-07-06 ENCOUNTER — Other Ambulatory Visit: Payer: Self-pay | Admitting: Family Medicine

## 2024-07-06 DIAGNOSIS — F411 Generalized anxiety disorder: Secondary | ICD-10-CM
